# Patient Record
Sex: Female | Born: 1945 | Hispanic: No | Marital: Married | Smoking: Former smoker
Health system: Southern US, Community
[De-identification: ages and names within clinical notes are randomized; demographics above are authoritative.]

## PROBLEM LIST (undated history)

## (undated) DIAGNOSIS — Z9889 Other specified postprocedural states: Secondary | ICD-10-CM

## (undated) DIAGNOSIS — R112 Nausea with vomiting, unspecified: Secondary | ICD-10-CM

## (undated) HISTORY — PX: TONSILLECTOMY: SUR1361

## (undated) HISTORY — PX: ABDOMINAL HYSTERECTOMY: SHX81

---

## 1998-10-21 ENCOUNTER — Other Ambulatory Visit: Admission: RE | Admit: 1998-10-21 | Discharge: 1998-10-21 | Payer: Self-pay | Admitting: Obstetrics and Gynecology

## 2000-03-28 ENCOUNTER — Other Ambulatory Visit: Admission: RE | Admit: 2000-03-28 | Discharge: 2000-03-28 | Payer: Self-pay | Admitting: Obstetrics and Gynecology

## 2005-09-27 ENCOUNTER — Other Ambulatory Visit: Admission: RE | Admit: 2005-09-27 | Discharge: 2005-09-27 | Payer: Self-pay | Admitting: Family Medicine

## 2009-03-31 ENCOUNTER — Other Ambulatory Visit: Admission: RE | Admit: 2009-03-31 | Discharge: 2009-03-31 | Payer: Self-pay | Admitting: Family Medicine

## 2014-09-30 ENCOUNTER — Other Ambulatory Visit: Payer: Self-pay | Admitting: Orthopedic Surgery

## 2014-10-27 ENCOUNTER — Encounter (HOSPITAL_BASED_OUTPATIENT_CLINIC_OR_DEPARTMENT_OTHER): Payer: Self-pay

## 2014-10-30 ENCOUNTER — Ambulatory Visit (HOSPITAL_BASED_OUTPATIENT_CLINIC_OR_DEPARTMENT_OTHER): Payer: 59 | Admitting: Anesthesiology

## 2014-10-30 ENCOUNTER — Encounter (HOSPITAL_BASED_OUTPATIENT_CLINIC_OR_DEPARTMENT_OTHER)
Admission: RE | Disposition: A | Discharge status: Home / Self Care | Payer: Self-pay | Source: Ambulatory Visit | Attending: Orthopedic Surgery

## 2014-10-30 ENCOUNTER — Encounter (HOSPITAL_BASED_OUTPATIENT_CLINIC_OR_DEPARTMENT_OTHER): Payer: Self-pay | Admitting: Anesthesiology

## 2014-10-30 ENCOUNTER — Ambulatory Visit (HOSPITAL_BASED_OUTPATIENT_CLINIC_OR_DEPARTMENT_OTHER)
Admission: RE | Admit: 2014-10-30 | Discharge: 2014-10-30 | Disposition: A | Discharge status: Home / Self Care | Payer: 59 | Source: Ambulatory Visit | Attending: Orthopedic Surgery | Admitting: Orthopedic Surgery

## 2014-10-30 DIAGNOSIS — Z880 Allergy status to penicillin: Secondary | ICD-10-CM | POA: Insufficient documentation

## 2014-10-30 DIAGNOSIS — M19031 Primary osteoarthritis, right wrist: Secondary | ICD-10-CM | POA: Diagnosis not present

## 2014-10-30 DIAGNOSIS — G5601 Carpal tunnel syndrome, right upper limb: Secondary | ICD-10-CM | POA: Insufficient documentation

## 2014-10-30 DIAGNOSIS — Z90710 Acquired absence of both cervix and uterus: Secondary | ICD-10-CM | POA: Diagnosis not present

## 2014-10-30 DIAGNOSIS — Z79899 Other long term (current) drug therapy: Secondary | ICD-10-CM | POA: Diagnosis not present

## 2014-10-30 DIAGNOSIS — Z87891 Personal history of nicotine dependence: Secondary | ICD-10-CM | POA: Insufficient documentation

## 2014-10-30 HISTORY — DX: Other specified postprocedural states: Z98.890

## 2014-10-30 HISTORY — PX: CARPAL TUNNEL RELEASE: SHX101

## 2014-10-30 HISTORY — DX: Nausea with vomiting, unspecified: R11.2

## 2014-10-30 LAB — POCT HEMOGLOBIN-HEMACUE: HEMOGLOBIN: 14.2 g/dL (ref 12.0–15.0)

## 2014-10-30 SURGERY — CARPAL TUNNEL RELEASE
Anesthesia: Regional | Laterality: Right

## 2014-10-30 MED ORDER — FENTANYL CITRATE (PF) 100 MCG/2ML IJ SOLN: 25.0000 ug | INTRAMUSCULAR | Status: DC | PRN

## 2014-10-30 MED ORDER — LIDOCAINE HCL (CARDIAC) 20 MG/ML IV SOLN
INTRAVENOUS | Status: DC | PRN
  Administered 2014-10-30: 50 mg via INTRAVENOUS

## 2014-10-30 MED ORDER — ACETAMINOPHEN 160 MG/5ML PO SOLN: 325.0000 mg | Freq: Once | ORAL | Status: DC

## 2014-10-30 MED ORDER — VANCOMYCIN HCL IN DEXTROSE 1-5 GM/200ML-% IV SOLN
1000.0000 mg | INTRAVENOUS | Status: AC
  Administered 2014-10-30: 1000 mg via INTRAVENOUS

## 2014-10-30 MED ORDER — BUPIVACAINE HCL (PF) 0.25 % IJ SOLN
INTRAMUSCULAR | Status: DC | PRN
  Administered 2014-10-30: 8 mL

## 2014-10-30 MED ORDER — FENTANYL CITRATE (PF) 100 MCG/2ML IJ SOLN
INTRAMUSCULAR | Status: AC
  Filled 2014-10-30: qty 6

## 2014-10-30 MED ORDER — MIDAZOLAM HCL 2 MG/2ML IJ SOLN
INTRAMUSCULAR | Status: AC
  Filled 2014-10-30: qty 2

## 2014-10-30 MED ORDER — VANCOMYCIN HCL IN DEXTROSE 1-5 GM/200ML-% IV SOLN
INTRAVENOUS | Status: AC
  Filled 2014-10-30: qty 200

## 2014-10-30 MED ORDER — MIDAZOLAM HCL 2 MG/2ML IJ SOLN
1.0000 mg | INTRAMUSCULAR | Status: DC | PRN
  Administered 2014-10-30: 2 mg via INTRAVENOUS

## 2014-10-30 MED ORDER — PROPOFOL INFUSION 10 MG/ML OPTIME
INTRAVENOUS | Status: DC | PRN
  Administered 2014-10-30: 75 ug/kg/min via INTRAVENOUS

## 2014-10-30 MED ORDER — LACTATED RINGERS IV SOLN
INTRAVENOUS | Status: DC
Start: 2014-10-30 — End: 2014-10-30
  Administered 2014-10-30 (×2): via INTRAVENOUS

## 2014-10-30 MED ORDER — CHLORHEXIDINE GLUCONATE 4 % EX LIQD: 60.0000 mL | Freq: Once | CUTANEOUS | Status: DC

## 2014-10-30 MED ORDER — GLYCOPYRROLATE 0.2 MG/ML IJ SOLN: 0.2000 mg | Freq: Once | INTRAMUSCULAR | Status: DC | PRN

## 2014-10-30 MED ORDER — SCOPOLAMINE 1 MG/3DAYS TD PT72: 1.0000 | MEDICATED_PATCH | Freq: Once | TRANSDERMAL | Status: DC | PRN

## 2014-10-30 MED ORDER — FENTANYL CITRATE (PF) 100 MCG/2ML IJ SOLN
50.0000 ug | INTRAMUSCULAR | Status: DC | PRN
  Administered 2014-10-30: 25 ug via INTRAVENOUS

## 2014-10-30 MED ORDER — ACETAMINOPHEN 500 MG PO TABS
1000.0000 mg | ORAL_TABLET | Freq: Once | ORAL | Status: DC
Start: 2014-10-30 — End: 2014-10-30

## 2014-10-30 MED ORDER — PROMETHAZINE HCL 25 MG/ML IJ SOLN
6.2500 mg | INTRAMUSCULAR | Status: DC | PRN
Start: 2014-10-30 — End: 2014-10-30

## 2014-10-30 MED ORDER — HYDROCODONE-ACETAMINOPHEN 5-325 MG PO TABS: 1.0000 | ORAL_TABLET | Freq: Four times a day (QID) | ORAL | Status: DC | PRN

## 2014-10-30 SURGICAL SUPPLY — 35 items
BLADE SURG 15 STRL LF DISP TIS (BLADE) ×1 IMPLANT
BLADE SURG 15 STRL SS (BLADE) ×2
BNDG CMPR 9X4 STRL LF SNTH (GAUZE/BANDAGES/DRESSINGS)
BNDG COHESIVE 3X5 TAN STRL LF (GAUZE/BANDAGES/DRESSINGS) ×2 IMPLANT
BNDG ESMARK 4X9 LF (GAUZE/BANDAGES/DRESSINGS) IMPLANT
BNDG GAUZE ELAST 4 BULKY (GAUZE/BANDAGES/DRESSINGS) ×2 IMPLANT
CHLORAPREP W/TINT 26ML (MISCELLANEOUS) ×2 IMPLANT
CORDS BIPOLAR (ELECTRODE) ×2 IMPLANT
COVER BACK TABLE 60X90IN (DRAPES) ×2 IMPLANT
COVER MAYO STAND STRL (DRAPES) ×2 IMPLANT
CUFF TOURNIQUET SINGLE 18IN (TOURNIQUET CUFF) ×2 IMPLANT
DRAPE EXTREMITY T 121X128X90 (DRAPE) ×2 IMPLANT
DRAPE SURG 17X23 STRL (DRAPES) ×2 IMPLANT
DRSG PAD ABDOMINAL 8X10 ST (GAUZE/BANDAGES/DRESSINGS) ×2 IMPLANT
GAUZE SPONGE 4X4 12PLY STRL (GAUZE/BANDAGES/DRESSINGS) ×2 IMPLANT
GAUZE XEROFORM 1X8 LF (GAUZE/BANDAGES/DRESSINGS) ×2 IMPLANT
GLOVE BIOGEL PI IND STRL 7.0 (GLOVE) IMPLANT
GLOVE BIOGEL PI IND STRL 8.5 (GLOVE) ×1 IMPLANT
GLOVE BIOGEL PI INDICATOR 7.0 (GLOVE) ×2
GLOVE BIOGEL PI INDICATOR 8.5 (GLOVE) ×1
GLOVE ECLIPSE 6.5 STRL STRAW (GLOVE) ×1 IMPLANT
GLOVE SURG ORTHO 8.0 STRL STRW (GLOVE) ×2 IMPLANT
GOWN STRL REUS W/ TWL LRG LVL3 (GOWN DISPOSABLE) ×1 IMPLANT
GOWN STRL REUS W/TWL LRG LVL3 (GOWN DISPOSABLE) ×2
GOWN STRL REUS W/TWL XL LVL3 (GOWN DISPOSABLE) ×2 IMPLANT
NEEDLE PRECISIONGLIDE 27X1.5 (NEEDLE) IMPLANT
NS IRRIG 1000ML POUR BTL (IV SOLUTION) ×2 IMPLANT
PACK BASIN DAY SURGERY FS (CUSTOM PROCEDURE TRAY) ×2 IMPLANT
STOCKINETTE 4X48 STRL (DRAPES) ×2 IMPLANT
SUT ETHILON 4 0 PS 2 18 (SUTURE) ×2 IMPLANT
SUT VICRYL 4-0 PS2 18IN ABS (SUTURE) IMPLANT
SYR BULB 3OZ (MISCELLANEOUS) ×2 IMPLANT
SYR CONTROL 10ML LL (SYRINGE) ×2 IMPLANT
TOWEL OR 17X24 6PK STRL BLUE (TOWEL DISPOSABLE) ×2 IMPLANT
UNDERPAD 30X30 (UNDERPADS AND DIAPERS) ×3 IMPLANT

## 2014-10-30 NOTE — Brief Op Note (Signed)
10/30/2014  16:10 PM  PATIENT:  Susan Moreno  69 y.o. female  PRE-OPERATIVE DIAGNOSIS:  CARPEL TUNNEL SYNDROME RIGHT  POST-OPERATIVE DIAGNOSIS:  CARPEL TUNNEL SYNDROME RIGHT  PROCEDURE:  Procedure(s) with comments: CARPAL TUNNEL RELEASE RIGHT (Right) - ANESTHESIA: IV REGIONAL FAB  SURGEON:  Surgeon(s) and Role:    * Daryll Brod, MD - Primary  PHYSICIAN ASSISTANT:   ASSISTANTS: none   ANESTHESIA:   local and regional  EBL:  Total I/O In: 900 [I.V.:900] Out: -   BLOOD ADMINISTERED:none  DRAINS: none   LOCAL MEDICATIONS USED:  BUPIVICAINE   SPECIMEN:  No Specimen  DISPOSITION OF SPECIMEN:  N/A  COUNTS:  YES  TOURNIQUET:   Total Tourniquet Time Documented: Forearm (Right) - 16 minutes Total: Forearm (Right) - 16 minutes   DICTATION: .Other Dictation: Dictation Number [id number]  PLAN OF CARE: Discharge to home after PACU  PATIENT DISPOSITION:  PACU - hemodynamically stable.

## 2014-10-30 NOTE — H&P (Signed)
Susan Moreno is a 69 year old right hand dominant female who comes in complaining of numbness and tingling in her right hand that has been going on for a year and increasing over the past several months to the point that her right hand is numb virtually all the time. She is a Optometrist. She states this seems to aggravate it. She has no history of injury to the hand or neck. She is occasionally awakened at the present time. She states when it began she was frequently awakened at night with numbness and tingling. She has been taking Naprosyn at a decreased dosage. She states it helps other things but does not seem to make a difference in her hand. There is no history of diabetes, thyroid problems, arthritis or gout. She complains of intermittent, sharp pain that she feels is getting worse. Activity makes it worse especially reading. She has had her nerve conductions done by Dr. Zebedee Iba. This reveals a carpal tunnel syndrome bilaterally with motor delay of 6.8 on the right, 4.3 on the left, sensory delay of 3.1 on the right and 2.8 on the left. Amplitude diminution is to 26.7 on the left and 14 on the right  PAST MEDICAL HISTORY:  She is allergic to PCN. She is on Fenofibrate, Naprosyn, Pravastatin. She has had a hysterectomy.  FAMILY MEDICAL HISTORY: Positive for arthritis  SOCIAL HISTORY:  She does not smoke. She drinks socially. She is married and a Print production planner at [employer]  REVIEW OF SYSTEMS: Positive for glasses, contacts, sleep disorder, otherwise negative 14 points.  Susan Moreno is an 69 y.o. female.   Chief Complaint: Carpal tunnel right HPI: see above  Past Medical History  Diagnosis Date  . PONV (postoperative nausea and vomiting)     Past Surgical History  Procedure Laterality Date  . Tonsillectomy    . Abdominal hysterectomy      History reviewed. No pertinent family history. Social History:  reports that she drinks about 0.6 oz of alcohol per  week. She reports that she does not use illicit drugs. Her tobacco history is not on file.  Allergies:  Allergies  Allergen Reactions  . Penicillins Itching    No prescriptions prior to admission    No results found for this or any previous visit (from the past 48 hour(s)).  No results found.   Pertinent items are noted in HPI.  Height 5\' 4"  (1.626 m), weight 95.255 kg (210 lb).  General appearance: alert, cooperative and appears stated age Head: Normocephalic, without obvious abnormality Neck: no JVD Resp: clear to auscultation bilaterally Cardio: regular rate and rhythm, S1, S2 normal, no murmur, click, rub or gallop GI: soft, non-tender; bowel sounds normal; no masses,  no organomegaly Extremities: numbness right hand Pulses: 2+ and symmetric Skin: Skin color, texture, turgor normal. No rashes or lesions Neurologic: Grossly normal Incision/Wound: na  Assessment/Plan X-rays of her hand reveals degenerative changes of the [facility] joint of her thumb, PIP and DIP joints of all fingers.  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  PLAN: We discussed the possibility of surgical intervention especially on the right side. Pre, peri and post op care are discussed along with risks and complications. Patient is aware there is no guarantee with surgery, possibility of infection, injury to arteries, nerves, and tendons, incomplete relief and dystrophy. We have discussed the possibility of injections with her. She would like to have the  carpal tunnel release to her right side. She would like to proceed. This isscheduled at her convenience as an outpatient under regional anesthesia for right carpal tunnel release.  Bird Swetz R 10/30/2014, 7:39 AM

## 2014-10-30 NOTE — Discharge Instructions (Addendum)

## 2014-10-30 NOTE — Op Note (Signed)
Dictation Number [id number]

## 2014-10-30 NOTE — Transfer of Care (Signed)
Immediate Anesthesia Transfer of Care Note  Patient: Susan Moreno  Procedure(s) Performed: Procedure(s) with comments: CARPAL TUNNEL RELEASE RIGHT (Right) - ANESTHESIA: IV REGIONAL FAB  Patient Location: PACU  Anesthesia Type:Bier block  Level of Consciousness: awake, sedated and patient cooperative  Airway & Oxygen Therapy: Patient Spontanous Breathing and Patient connected to face mask oxygen  Post-op Assessment: Report given to RN and Post -op Vital signs reviewed and stable  Post vital signs: Reviewed and stable  Last Vitals:  Filed Vitals:   10/30/14 0952  BP: 159/85  Pulse: 69  Temp: 37.1 C  Resp: 16    Complications: No apparent anesthesia complications

## 2014-10-30 NOTE — Anesthesia Postprocedure Evaluation (Signed)
  Anesthesia Post-op Note  Patient: Susan Moreno  Procedure(s) Performed: Procedure(s) (LRB): CARPAL TUNNEL RELEASE RIGHT (Right)  Patient Location: PACU  Anesthesia Type: Bier block  Level of Consciousness: awake and alert   Airway and Oxygen Therapy: Patient Spontanous Breathing  Post-op Pain: mild  Post-op Assessment: Post-op Vital signs reviewed, Patient's Cardiovascular Status Stable, Respiratory Function Stable, Patent Airway and No signs of Nausea or vomiting  Last Vitals:  Filed Vitals:   10/30/14 0952  BP: 159/85  Pulse: 69  Temp: 37.1 C  Resp: 16    Post-op Vital Signs: stable   Complications: No apparent anesthesia complications

## 2014-10-30 NOTE — Anesthesia Preprocedure Evaluation (Addendum)
Anesthesia Evaluation  Patient identified by MRN, date of birth, ID band Patient awake    Reviewed: Allergy & Precautions, H&P , NPO status , Patient's Chart, lab work & pertinent test results  History of Anesthesia Complications (+) PONV and history of anesthetic complications  Airway Mallampati: II  TM Distance: >3 FB Neck ROM: full    Dental no notable dental hx.    Pulmonary neg pulmonary ROS, former smoker,  breath sounds clear to auscultation  Pulmonary exam normal       Cardiovascular Normal cardiovascular examRhythm:regular Rate:Normal  HLD   Neuro/Psych negative neurological ROS     GI/Hepatic negative GI ROS, Neg liver ROS,   Endo/Other  negative endocrine ROS  Renal/GU negative Renal ROS     Musculoskeletal  (+) Arthritis - (carpel tunnel release 10/30/2014),   Abdominal   Peds  Hematology negative hematology ROS (+)   Anesthesia Other Findings   Reproductive/Obstetrics negative OB ROS                            Anesthesia Physical Anesthesia Plan  ASA: II  Anesthesia Plan: Bier Block   Post-op Pain Management:    Induction: Intravenous  Airway Management Planned: Simple Face Mask  Additional Equipment:   Intra-op Plan:   Post-operative Plan:   Informed Consent: I have reviewed the patients History and Physical, chart, labs and discussed the procedure including the risks, benefits and alternatives for the proposed anesthesia with the patient or authorized representative who has indicated his/her understanding and acceptance.     Plan Discussed with: Anesthesiologist, CRNA and Surgeon  Anesthesia Plan Comments:        Anesthesia Quick Evaluation

## 2014-10-31 ENCOUNTER — Encounter (HOSPITAL_BASED_OUTPATIENT_CLINIC_OR_DEPARTMENT_OTHER): Payer: Self-pay | Admitting: Orthopedic Surgery

## 2014-10-31 NOTE — Op Note (Signed)
NAME:  Susan Moreno, Susan Moreno NO.:  [id number]  MEDICAL RECORD NO.:  [id number]  LOCATION:                                 FACILITY:  PHYSICIAN:  Daryll Brod, M.D.            DATE OF BIRTH:  DATE OF PROCEDURE:  10/30/2014 DATE OF DISCHARGE:                              OPERATIVE REPORT   PREOPERATIVE DIAGNOSIS:  Carpal tunnel syndrome, right hand.  POSTOPERATIVE DIAGNOSIS:  Carpal tunnel syndrome, right hand.  OPERATION:  Decompression of the right median nerve.  SURGEON:  Daryll Brod, MD.  ANESTHESIA:  Forearm-based IV regional with local infiltration.  HISTORY:  The patient is a 69 year old female with a history of carpal tunnel syndrome, nerve conductions are positive.  She has elected to undergo surgical release of the median nerve.  She is aware of risks and complications including infection, recurrence of injury to arteries, nerves, tendons; relief of symptoms,  dystrophy.  In preoperative area, the patient was seen, the extremity marked by both patient and surgeon. Antibiotic given.  PROCEDURE IN DETAIL:  The patient was brought to the operating room, where a  forearm-based IV regional anesthetic was carried out without difficulty.  She was prepped using ChloraPrep, supine position with the right arm free.  A 3-minute dry time was allowed.  Time-out taken, confirming the patient and procedure.  A longitudinal incision was made in the right palm, carried down through  subcutaneous tissue.  Bleeders were electrocauterized.  Palmar fascia was split.  Superficial palmar arch identified.  The flexor tendon to the ring and little finger identified to the ulnar side of the median nerve.  Carpal retinaculum was incised with sharp dissection.  Right angle and Sewall retractor were placed between skin and forearm fascia.  The fascia was  released for approximately a centimeter and half proximal to the wrist crease under direct vision.  Canal was explored.  Air  compression to the nerve was apparent.  No further lesions were identified.  The wound was copiously irrigated with saline and the skin closed with interrupted 4-0 nylon sutures.  Local infiltration with 0.25% bupivacaine without epinephrine was given, approximately 8 mL was used.  Sterile compressive dressing with fingers free was applied.  On deflation of the tourniquet, all fingers were immediately pinked.  She was taken to the recovery room for observation in satisfactory condition.  She will be discharged home to return to [place] in 1 week on [place].          ______________________________ Daryll Brod, M.D.    GK/MEDQ  D:  10/30/2014  T:  10/31/2014  Job:  [id number]

## 2016-02-16 DIAGNOSIS — H401131 Primary open-angle glaucoma, bilateral, mild stage: Secondary | ICD-10-CM | POA: Diagnosis not present

## 2016-03-02 DIAGNOSIS — Z23 Encounter for immunization: Secondary | ICD-10-CM | POA: Diagnosis not present

## 2016-04-26 DIAGNOSIS — H401131 Primary open-angle glaucoma, bilateral, mild stage: Secondary | ICD-10-CM | POA: Diagnosis not present

## 2016-04-28 DIAGNOSIS — R509 Fever, unspecified: Secondary | ICD-10-CM | POA: Diagnosis not present

## 2016-04-28 DIAGNOSIS — G4700 Insomnia, unspecified: Secondary | ICD-10-CM | POA: Diagnosis not present

## 2016-04-28 DIAGNOSIS — J111 Influenza due to unidentified influenza virus with other respiratory manifestations: Secondary | ICD-10-CM | POA: Diagnosis not present

## 2016-07-14 DIAGNOSIS — H401131 Primary open-angle glaucoma, bilateral, mild stage: Secondary | ICD-10-CM | POA: Diagnosis not present

## 2016-09-13 DIAGNOSIS — H401131 Primary open-angle glaucoma, bilateral, mild stage: Secondary | ICD-10-CM | POA: Diagnosis not present

## 2016-11-29 ENCOUNTER — Ambulatory Visit (INDEPENDENT_AMBULATORY_CARE_PROVIDER_SITE_OTHER): Payer: BLUE CROSS/BLUE SHIELD | Admitting: Podiatry

## 2016-11-29 ENCOUNTER — Encounter: Payer: Self-pay | Admitting: Podiatry

## 2016-11-29 ENCOUNTER — Ambulatory Visit (INDEPENDENT_AMBULATORY_CARE_PROVIDER_SITE_OTHER): Payer: BLUE CROSS/BLUE SHIELD

## 2016-11-29 VITALS — BP 149/91 | HR 99 | Resp 16

## 2016-11-29 DIAGNOSIS — M779 Enthesopathy, unspecified: Secondary | ICD-10-CM

## 2016-11-29 DIAGNOSIS — M7752 Other enthesopathy of left foot: Secondary | ICD-10-CM | POA: Diagnosis not present

## 2016-11-29 DIAGNOSIS — M2041 Other hammer toe(s) (acquired), right foot: Secondary | ICD-10-CM | POA: Diagnosis not present

## 2016-11-29 DIAGNOSIS — M778 Other enthesopathies, not elsewhere classified: Secondary | ICD-10-CM

## 2016-11-29 NOTE — Progress Notes (Signed)
  Subjective:  Patient ID: Susan Moreno, female    DOB: 11-23-1945,  MRN: [id number] HPI Chief Complaint  Patient presents with  . Ankle Pain    Lateral ankle left - pain and swelling x 1 year, no injury, numbness in 1st, 2nd, 3rd toes   . Toe Pain    2nd toe right - hammer toe  . Nail Problem    2nd nails bilateral and 1st left - concerned with how the nails are curving inward    71 y.o. female presents with the above complaint.   She presents today for chief complaint of swelling to the lateral aspect of the ankle and painful toes #2 #3 and #1 of the left foot. Denies any trauma today states that the pain as well as been present for nearly a year with no injury. No numbness to the toe started out initially periodic and now has become chronic.  Past Medical History:  Diagnosis Date  . PONV (postoperative nausea and vomiting)    Past Surgical History:  Procedure Laterality Date  . ABDOMINAL HYSTERECTOMY    . CARPAL TUNNEL RELEASE Right 10/30/2014   Procedure: CARPAL TUNNEL RELEASE RIGHT;  Surgeon: Daryll Brod, MD;  Location: [facility];  Service: Orthopedics;  Laterality: Right;  ANESTHESIA: IV REGIONAL FAB  . TONSILLECTOMY      Current Outpatient Prescriptions:  .  dorzolamide-timolol (COSOPT) 22.3-6.8 MG/ML ophthalmic solution, , Disp: , Rfl: 0 .  pravastatin (PRAVACHOL) 10 MG tablet, Take 40 mg by mouth daily. , Disp: , Rfl:  .  traZODone (DESYREL) 100 MG tablet, , Disp: , Rfl: 0  Allergies  Allergen Reactions  . Penicillins Itching   Review of Systems  Musculoskeletal: Positive for arthralgias and back pain.  All other systems reviewed and are negative.  Objective:   Vitals:   11/29/16 1444  BP: (!) 149/91  Pulse: 99  Resp: 16   General AA&O x3. Patient is alert and oriented.  Vascular Dorsalis pedis and posterior tibial pulses 2/4 bilat. Brisk capillary refill to all digits. Pedal hair present.  Neurologic Epicritic sensation grossly intact.    Dermatologic No open lesions. Interspaces clear of maceration. Nails well groomed and normal in appearance.  Orthopedic: MMT 5/5 in dorsiflexion, plantarflexion, inversion, and eversion. Normal joint ROM without pain or crepitus.She has pain on end range of motion of subtalar joint left foot. Large dorsal spur with what appears to be fluctuant cyst or ganglion overlying the base of the second metatarsal intermediate cuneiform joint left foot. Radiographs confirm dorsal spurring today and some early joint space narrowing of this subtalar joint.    Radiographs:  Radiographs taken today 3 views demonstrate dorsal spurring left foot ankle appears to be normal no complications early osteoarthritic change subtalar joint.  Assessment & Plan:   Assessment: Subtalar joint capsulitis left. Neuritis associated with dorsal spurring.  Plan: Discussed etiology pathology inserter versus surgical therapies. At this point I injected the subtalar joint of the left foot. We'll follow up with her in 1 month which time we will consider dorsal injection.     Susan Moreno T. [place], [place]

## 2016-12-16 DIAGNOSIS — H5213 Myopia, bilateral: Secondary | ICD-10-CM | POA: Diagnosis not present

## 2016-12-16 DIAGNOSIS — H25811 Combined forms of age-related cataract, right eye: Secondary | ICD-10-CM | POA: Diagnosis not present

## 2016-12-16 DIAGNOSIS — H04123 Dry eye syndrome of bilateral lacrimal glands: Secondary | ICD-10-CM | POA: Diagnosis not present

## 2016-12-16 DIAGNOSIS — H401134 Primary open-angle glaucoma, bilateral, indeterminate stage: Secondary | ICD-10-CM | POA: Diagnosis not present

## 2017-01-10 ENCOUNTER — Encounter: Payer: Self-pay | Admitting: Podiatry

## 2017-01-10 ENCOUNTER — Ambulatory Visit (INDEPENDENT_AMBULATORY_CARE_PROVIDER_SITE_OTHER): Payer: BLUE CROSS/BLUE SHIELD | Admitting: Podiatry

## 2017-01-10 DIAGNOSIS — M7752 Other enthesopathy of left foot: Secondary | ICD-10-CM

## 2017-01-10 DIAGNOSIS — G5792 Unspecified mononeuropathy of left lower limb: Secondary | ICD-10-CM | POA: Diagnosis not present

## 2017-01-10 DIAGNOSIS — M778 Other enthesopathies, not elsewhere classified: Secondary | ICD-10-CM

## 2017-01-10 DIAGNOSIS — M779 Enthesopathy, unspecified: Principal | ICD-10-CM

## 2017-01-10 NOTE — Progress Notes (Signed)
She presents today for follow-up of her subtalar joint capsulitis and neuritis to the toes. She states the ankle is still sore but the neuritis or the pain in the toes is cleared up almost completely. She states that she will be going to [place] and would like to follow-up with me prior to June if necessary.  Objective: Vital signs are stable she is alert and oriented 3 no reproducible pain other than some tendpalpation of the sinus tarsiof the left foot. We discussed appropriate shoe gear for her today.   Assessment:subtalar joint capsulitis with neuritis resolved.  Plan: Follow up with me prior to June for subtalar joint injection.

## 2017-01-17 DIAGNOSIS — L851 Acquired keratosis [keratoderma] palmaris et plantaris: Secondary | ICD-10-CM | POA: Diagnosis not present

## 2017-01-24 DIAGNOSIS — H25811 Combined forms of age-related cataract, right eye: Secondary | ICD-10-CM | POA: Diagnosis not present

## 2017-01-24 DIAGNOSIS — H52201 Unspecified astigmatism, right eye: Secondary | ICD-10-CM | POA: Diagnosis not present

## 2017-01-24 DIAGNOSIS — H268 Other specified cataract: Secondary | ICD-10-CM | POA: Diagnosis not present

## 2017-04-12 DIAGNOSIS — Z23 Encounter for immunization: Secondary | ICD-10-CM | POA: Diagnosis not present

## 2017-04-12 DIAGNOSIS — J309 Allergic rhinitis, unspecified: Secondary | ICD-10-CM | POA: Diagnosis not present

## 2017-04-12 DIAGNOSIS — G4700 Insomnia, unspecified: Secondary | ICD-10-CM | POA: Diagnosis not present

## 2017-04-12 DIAGNOSIS — E782 Mixed hyperlipidemia: Secondary | ICD-10-CM | POA: Diagnosis not present

## 2017-04-13 DIAGNOSIS — Z961 Presence of intraocular lens: Secondary | ICD-10-CM | POA: Diagnosis not present

## 2017-05-15 DIAGNOSIS — D23112 Other benign neoplasm of skin of right lower eyelid, including canthus: Secondary | ICD-10-CM | POA: Diagnosis not present

## 2017-05-15 DIAGNOSIS — H401132 Primary open-angle glaucoma, bilateral, moderate stage: Secondary | ICD-10-CM | POA: Diagnosis not present

## 2017-05-15 DIAGNOSIS — H26492 Other secondary cataract, left eye: Secondary | ICD-10-CM | POA: Diagnosis not present

## 2017-05-15 DIAGNOSIS — D23122 Other benign neoplasm of skin of left lower eyelid, including canthus: Secondary | ICD-10-CM | POA: Diagnosis not present

## 2017-07-27 DIAGNOSIS — D23111 Other benign neoplasm of skin of right upper eyelid, including canthus: Secondary | ICD-10-CM | POA: Diagnosis not present

## 2017-07-27 DIAGNOSIS — D23112 Other benign neoplasm of skin of right lower eyelid, including canthus: Secondary | ICD-10-CM | POA: Diagnosis not present

## 2017-07-27 DIAGNOSIS — D23121 Other benign neoplasm of skin of left upper eyelid, including canthus: Secondary | ICD-10-CM | POA: Diagnosis not present

## 2017-07-27 DIAGNOSIS — D23122 Other benign neoplasm of skin of left lower eyelid, including canthus: Secondary | ICD-10-CM | POA: Diagnosis not present

## 2017-08-17 DIAGNOSIS — Z23 Encounter for immunization: Secondary | ICD-10-CM | POA: Diagnosis not present

## 2017-08-24 DIAGNOSIS — H401132 Primary open-angle glaucoma, bilateral, moderate stage: Secondary | ICD-10-CM | POA: Diagnosis not present

## 2017-09-29 DIAGNOSIS — H26493 Other secondary cataract, bilateral: Secondary | ICD-10-CM | POA: Diagnosis not present

## 2017-09-29 DIAGNOSIS — H401132 Primary open-angle glaucoma, bilateral, moderate stage: Secondary | ICD-10-CM | POA: Diagnosis not present

## 2017-09-29 DIAGNOSIS — H43813 Vitreous degeneration, bilateral: Secondary | ICD-10-CM | POA: Diagnosis not present

## 2017-09-29 DIAGNOSIS — H53131 Sudden visual loss, right eye: Secondary | ICD-10-CM | POA: Diagnosis not present

## 2018-01-29 DIAGNOSIS — Z961 Presence of intraocular lens: Secondary | ICD-10-CM | POA: Diagnosis not present

## 2018-01-29 DIAGNOSIS — H26493 Other secondary cataract, bilateral: Secondary | ICD-10-CM | POA: Diagnosis not present

## 2018-01-29 DIAGNOSIS — H401132 Primary open-angle glaucoma, bilateral, moderate stage: Secondary | ICD-10-CM | POA: Diagnosis not present

## 2018-11-02 ENCOUNTER — Other Ambulatory Visit: Payer: Self-pay

## 2018-11-02 DIAGNOSIS — Z20822 Contact with and (suspected) exposure to covid-19: Secondary | ICD-10-CM

## 2018-11-03 LAB — NOVEL CORONAVIRUS, NAA: SARS-CoV-2, NAA: NOT DETECTED

## 2019-02-12 ENCOUNTER — Emergency Department (HOSPITAL_BASED_OUTPATIENT_CLINIC_OR_DEPARTMENT_OTHER): Payer: Medicare Other

## 2019-02-12 ENCOUNTER — Other Ambulatory Visit: Payer: Self-pay

## 2019-02-12 ENCOUNTER — Inpatient Hospital Stay (HOSPITAL_BASED_OUTPATIENT_CLINIC_OR_DEPARTMENT_OTHER)
Admission: EM | Admit: 2019-02-12 | Discharge: 2019-02-14 | DRG: 340 | Disposition: A | Discharge status: Home / Self Care | Payer: Medicare Other | Attending: Surgery | Admitting: Surgery

## 2019-02-12 ENCOUNTER — Encounter (HOSPITAL_BASED_OUTPATIENT_CLINIC_OR_DEPARTMENT_OTHER): Payer: Self-pay | Admitting: *Deleted

## 2019-02-12 DIAGNOSIS — Z79899 Other long term (current) drug therapy: Secondary | ICD-10-CM | POA: Diagnosis not present

## 2019-02-12 DIAGNOSIS — K3533 Acute appendicitis with perforation and localized peritonitis, with abscess: Principal | ICD-10-CM | POA: Diagnosis present

## 2019-02-12 DIAGNOSIS — Z87891 Personal history of nicotine dependence: Secondary | ICD-10-CM

## 2019-02-12 DIAGNOSIS — Z880 Allergy status to penicillin: Secondary | ICD-10-CM | POA: Diagnosis not present

## 2019-02-12 DIAGNOSIS — Z20828 Contact with and (suspected) exposure to other viral communicable diseases: Secondary | ICD-10-CM | POA: Diagnosis present

## 2019-02-12 DIAGNOSIS — Z6835 Body mass index (BMI) 35.0-35.9, adult: Secondary | ICD-10-CM | POA: Diagnosis not present

## 2019-02-12 DIAGNOSIS — Z90710 Acquired absence of both cervix and uterus: Secondary | ICD-10-CM

## 2019-02-12 DIAGNOSIS — R1031 Right lower quadrant pain: Secondary | ICD-10-CM

## 2019-02-12 DIAGNOSIS — R1011 Right upper quadrant pain: Secondary | ICD-10-CM | POA: Diagnosis present

## 2019-02-12 DIAGNOSIS — E669 Obesity, unspecified: Secondary | ICD-10-CM | POA: Diagnosis present

## 2019-02-12 DIAGNOSIS — K3532 Acute appendicitis with perforation and localized peritonitis, without abscess: Secondary | ICD-10-CM | POA: Diagnosis present

## 2019-02-12 LAB — COMPREHENSIVE METABOLIC PANEL
ALT: 19 U/L (ref 0–44)
AST: 17 U/L (ref 15–41)
Albumin: 3.4 g/dL — ABNORMAL LOW (ref 3.5–5.0)
Alkaline Phosphatase: 89 U/L (ref 38–126)
Anion gap: 10 (ref 5–15)
BUN: 15 mg/dL (ref 8–23)
CO2: 23 mmol/L (ref 22–32)
Calcium: 9.1 mg/dL (ref 8.9–10.3)
Chloride: 99 mmol/L (ref 98–111)
Creatinine, Ser: 0.54 mg/dL (ref 0.44–1.00)
GFR calc Af Amer: >60 mL/min (ref >60)
GFR calc non Af Amer: >60 mL/min (ref >60)
Glucose, Bld: 116 mg/dL — ABNORMAL HIGH (ref 70–99)
Potassium: 3.4 mmol/L — ABNORMAL LOW (ref 3.5–5.1)
Sodium: 132 mmol/L — ABNORMAL LOW (ref 135–145)
Total Bilirubin: 1.1 mg/dL (ref 0.3–1.2)
Total Protein: 6.7 g/dL (ref 6.5–8.1)

## 2019-02-12 LAB — CBC WITH DIFFERENTIAL/PLATELET
Abs Immature Granulocytes: 0.27 10*3/uL — ABNORMAL HIGH (ref 0.00–0.07)
Basophils Absolute: 0 10*3/uL (ref 0.0–0.1)
Basophils Relative: 0 %
Eosinophils Absolute: 0 10*3/uL (ref 0.0–0.5)
Eosinophils Relative: 0 %
HCT: 42.5 % (ref 36.0–46.0)
Hemoglobin: 14.1 g/dL (ref 12.0–15.0)
Immature Granulocytes: 2 %
Lymphocytes Relative: 4 %
Lymphs Abs: 0.6 10*3/uL — ABNORMAL LOW (ref 0.7–4.0)
MCH: 30.3 pg (ref 26.0–34.0)
MCHC: 33.2 g/dL (ref 30.0–36.0)
MCV: 91.4 fL (ref 80.0–100.0)
Monocytes Absolute: 0.7 10*3/uL (ref 0.1–1.0)
Monocytes Relative: 4 %
Neutro Abs: 16.3 10*3/uL — ABNORMAL HIGH (ref 1.7–7.7)
Neutrophils Relative %: 90 %
Platelets: 322 10*3/uL (ref 150–400)
RBC: 4.65 MIL/uL (ref 3.87–5.11)
RDW: 12.1 % (ref 11.5–15.5)
WBC: 17.9 10*3/uL — ABNORMAL HIGH (ref 4.0–10.5)
nRBC: 0 % (ref 0.0–0.2)

## 2019-02-12 LAB — URINALYSIS, ROUTINE W REFLEX MICROSCOPIC
Bilirubin Urine: NEGATIVE
Glucose, UA: NEGATIVE mg/dL
Ketones, ur: 15 mg/dL — AB
Nitrite: NEGATIVE
Protein, ur: NEGATIVE mg/dL
Specific Gravity, Urine: <1.005 — ABNORMAL LOW (ref 1.005–1.030)
pH: 7 (ref 5.0–8.0)

## 2019-02-12 LAB — URINALYSIS, MICROSCOPIC (REFLEX)

## 2019-02-12 LAB — LIPASE, BLOOD: Lipase: 17 U/L (ref 11–51)

## 2019-02-12 LAB — LACTIC ACID, PLASMA
Lactic Acid, Venous: 0.9 mmol/L (ref 0.5–1.9)
Lactic Acid, Venous: 0.9 mmol/L (ref 0.5–1.9)

## 2019-02-12 LAB — SARS CORONAVIRUS 2 AG (30 MIN TAT): SARS Coronavirus 2 Ag: NEGATIVE

## 2019-02-12 MED ORDER — ACETAMINOPHEN 325 MG PO TABS
650.0000 mg | ORAL_TABLET | Freq: Once | ORAL | Status: AC
  Administered 2019-02-12: 650 mg via ORAL
  Filled 2019-02-12: qty 2

## 2019-02-12 MED ORDER — ONDANSETRON HCL 4 MG/2ML IJ SOLN
4.0000 mg | Freq: Once | INTRAMUSCULAR | Status: AC
  Administered 2019-02-12: 4 mg via INTRAVENOUS

## 2019-02-12 MED ORDER — SODIUM CHLORIDE 0.9 % IV BOLUS: 1000.0000 mL | Freq: Once | INTRAVENOUS | Status: DC

## 2019-02-12 MED ORDER — ONDANSETRON HCL 4 MG/2ML IJ SOLN
4.0000 mg | Freq: Once | INTRAMUSCULAR | Status: AC
  Administered 2019-02-12: 4 mg via INTRAVENOUS
  Filled 2019-02-12: qty 2

## 2019-02-12 MED ORDER — METRONIDAZOLE IN NACL 5-0.79 MG/ML-% IV SOLN
500.0000 mg | Freq: Once | INTRAVENOUS | Status: AC
  Administered 2019-02-12: 500 mg via INTRAVENOUS
  Filled 2019-02-12: qty 100

## 2019-02-12 MED ORDER — ONDANSETRON HCL 4 MG/2ML IJ SOLN
INTRAMUSCULAR | Status: AC
  Filled 2019-02-12: qty 2

## 2019-02-12 MED ORDER — SODIUM CHLORIDE 0.9 % IV BOLUS
1000.0000 mL | Freq: Once | INTRAVENOUS | Status: AC
  Administered 2019-02-12: 1000 mL via INTRAVENOUS

## 2019-02-12 MED ORDER — MORPHINE SULFATE (PF) 4 MG/ML IV SOLN
4.0000 mg | Freq: Once | INTRAVENOUS | Status: AC
  Administered 2019-02-12: 4 mg via INTRAVENOUS
  Filled 2019-02-12: qty 1

## 2019-02-12 MED ORDER — CIPROFLOXACIN IN D5W 400 MG/200ML IV SOLN
400.0000 mg | Freq: Once | INTRAVENOUS | Status: AC
  Administered 2019-02-12: 400 mg via INTRAVENOUS
  Filled 2019-02-12: qty 200

## 2019-02-12 MED ORDER — IOHEXOL 300 MG/ML  SOLN
100.0000 mL | Freq: Once | INTRAMUSCULAR | Status: AC | PRN
  Administered 2019-02-12: 100 mL via INTRAVENOUS

## 2019-02-12 MED ORDER — ONDANSETRON 4 MG PO TBDP
4.0000 mg | ORAL_TABLET | Freq: Once | ORAL | Status: AC
  Administered 2019-02-12: 4 mg via ORAL
  Filled 2019-02-12: qty 1

## 2019-02-12 NOTE — ED Triage Notes (Signed)
Abdominal pain, fever, nausea, lightheaded x 3 days.

## 2019-02-12 NOTE — ED Provider Notes (Signed)
[facility] EMERGENCY DEPARTMENT Provider Note   CSN: [id number] Arrival date & time: 02/12/19  1442     History   Chief Complaint Chief Complaint  Patient presents with  . Abdominal Pain    HPI Susan Moreno is a 73 y.o. female.     The history is provided by the patient and medical records. No language interpreter was used.  Abdominal Pain Pain location:  RUQ and RLQ Pain quality: aching, pressure and sharp   Pain radiates to:  Does not radiate Pain severity:  Moderate Onset quality:  Gradual Duration:  4 days Timing:  Constant Progression:  Waxing and waning Chronicity:  New Context: not recent illness and not trauma   Relieved by:  Nothing Worsened by:  Palpation Ineffective treatments:  None tried Associated symptoms: anorexia, chills, constipation, fatigue, fever, nausea and vomiting   Associated symptoms: no chest pain, no cough, no diarrhea, no dysuria, no flatus, no melena, no shortness of breath, no vaginal bleeding and no vaginal discharge     Past Medical History:  Diagnosis Date  . PONV (postoperative nausea and vomiting)     There are no active problems to display for this patient.   Past Surgical History:  Procedure Laterality Date  . ABDOMINAL HYSTERECTOMY    . CARPAL TUNNEL RELEASE Right 10/30/2014   Procedure: CARPAL TUNNEL RELEASE RIGHT;  Surgeon: Daryll Brod, MD;  Location: [facility];  Service: Orthopedics;  Laterality: Right;  ANESTHESIA: IV REGIONAL FAB  . TONSILLECTOMY       OB History   No obstetric history on file.      Home Medications    Prior to Admission medications   Medication Sig Start Date End Date Taking? Authorizing Provider  dorzolamide-timolol (COSOPT) 22.3-6.8 MG/ML ophthalmic solution  11/05/16  Yes [provider]  Escitalopram Oxalate (LEXAPRO PO) Take by mouth.   Yes [provider]  latanoprost (XALATAN) 0.005 % ophthalmic solution INSTILL 1 DROP IN BOTH EYES QHS  09/20/18  Yes [provider]  pravastatin (PRAVACHOL) 40 MG tablet take 1 tablet by mouth once daily NEED OV FOR MORE REFILLES 12/16/16  Yes [provider]  traZODone (DESYREL) 100 MG tablet  10/27/16  Yes [provider]  FLUZONE HIGH-DOSE 0.5 ML injection inject 0.5 milliliter intramuscularly 11/30/16   [provider]    Family History No family history on file.  Social History Social History   Tobacco Use  . Smoking status: Former Smoker    Types: Cigarettes  . Smokeless tobacco: Never Used  . Tobacco comment: quit 41 years ago  Substance Use Topics  . Alcohol use: Yes    Alcohol/week: 1.0 standard drinks    Types: 1 Glasses of wine per week  . Drug use: No     Allergies   Penicillins   Review of Systems Review of Systems  Constitutional: Positive for chills, fatigue and fever. Negative for diaphoresis.  HENT: Negative for congestion.   Respiratory: Negative for cough, chest tightness, shortness of breath and wheezing.   Cardiovascular: Negative for chest pain, palpitations and leg swelling.  Gastrointestinal: Positive for abdominal pain, anorexia, constipation, nausea and vomiting. Negative for abdominal distention, diarrhea, flatus and melena.  Genitourinary: Positive for flank pain. Negative for decreased urine volume, dysuria, frequency, genital sores, vaginal bleeding, vaginal discharge and vaginal pain.  Musculoskeletal: Negative for back pain, neck pain and neck stiffness.  Skin: Negative for rash and wound.  Neurological: Negative for light-headedness and headaches.  Psychiatric/Behavioral: Negative for agitation.  All other systems reviewed and are negative.    Physical Exam Updated Vital Signs BP 131/86   Pulse (!) 116   Temp (!) 101.6 F (38.7 C) (Oral)   Resp 20   Ht 5\' 5"  (1.651 m)   Wt 95.7 kg   SpO2 94%   BMI 35.11 kg/m   Physical Exam Vitals signs and nursing note reviewed.  Constitutional:      General:  She is not in acute distress.    Appearance: She is well-developed. She is not ill-appearing, toxic-appearing or diaphoretic.  HENT:     Head: Normocephalic and atraumatic.     Right Ear: External ear normal.     Left Ear: External ear normal.     Nose: Nose normal.     Mouth/Throat:     Pharynx: No oropharyngeal exudate.  Eyes:     General: No scleral icterus.    Conjunctiva/sclera: Conjunctivae normal.     Pupils: Pupils are equal, round, and reactive to light.  Neck:     Musculoskeletal: Normal range of motion and neck supple.  Cardiovascular:     Rate and Rhythm: Regular rhythm. Tachycardia present.     Heart sounds: Normal heart sounds. No murmur.  Pulmonary:     Effort: Pulmonary effort is normal. No respiratory distress.     Breath sounds: No stridor. No wheezing, rhonchi or rales.  Chest:     Chest wall: No tenderness.  Abdominal:     General: Abdomen is flat. A surgical scar is present. Bowel sounds are normal. There is no distension. There are no signs of injury.     Tenderness: There is abdominal tenderness in the right upper quadrant and right lower quadrant. There is no right CVA tenderness, left CVA tenderness, guarding or rebound.       Comments: No rashes seen  Skin:    General: Skin is warm.     Findings: No erythema or rash.  Neurological:     Mental Status: She is alert and oriented to person, place, and time.     Motor: No abnormal muscle tone.     Coordination: Coordination normal.     Deep Tendon Reflexes: Reflexes are normal and symmetric.  Psychiatric:        Mood and Affect: Mood normal.      ED Treatments / Results  Labs (all labs ordered are listed, but only abnormal results are displayed) Labs Reviewed  CBC WITH DIFFERENTIAL/PLATELET - Abnormal; Notable for the following components:      Result Value   WBC 17.9 (*)    Neutro Abs 16.3 (*)    Lymphs Abs 0.6 (*)    Abs Immature Granulocytes 0.27 (*)    All other components within normal  limits  COMPREHENSIVE METABOLIC PANEL - Abnormal; Notable for the following components:   Sodium 132 (*)    Potassium 3.4 (*)    Glucose, Bld 116 (*)    Albumin 3.4 (*)    All other components within normal limits  URINALYSIS, ROUTINE W REFLEX MICROSCOPIC - Abnormal; Notable for the following components:   Specific Gravity, Urine <1.005 (*)    Hgb urine dipstick SMALL (*)    Ketones, ur 15 (*)    Leukocytes,Ua TRACE (*)    All other components within normal limits  URINALYSIS, MICROSCOPIC (REFLEX) - Abnormal; Notable for the following components:   Bacteria, UA RARE (*)    All other components within normal limits  SARS  CORONAVIRUS 2 AG (30 MIN TAT)  URINE CULTURE  CULTURE, BLOOD (ROUTINE X 2)  CULTURE, BLOOD (ROUTINE X 2)  SARS CORONAVIRUS 2 (TAT 6-24 HRS)  LIPASE, BLOOD  LACTIC ACID, PLASMA  LACTIC ACID, PLASMA    EKG EKG Interpretation  Date/Time:  Tuesday February 12 2019 15:23:36 EST Ventricular Rate:  112 PR Interval:    QRS Duration: 89 QT Interval:  318 QTC Calculation: 434 R Axis:   91 Text Interpretation: Sinus tachycardia Right axis deviation Borderline T abnormalities, inferior leads No prior ECG for comparison. No STEMI Confirmed by Antony Blackbird [phone]) on 02/12/2019 3:28:38 PM   Radiology Ct Abdomen Pelvis W Contrast  Result Date: 02/12/2019 CLINICAL DATA:  Right-sided abdominal pain, right upper quadrant and right mid abdomen. Nausea and vomiting. Decreased bowel movement. Fever, chills, tachycardia. EXAM: CT ABDOMEN AND PELVIS WITH CONTRAST TECHNIQUE: Multidetector CT imaging of the abdomen and pelvis was performed using the standard protocol following bolus administration of intravenous contrast. CONTRAST:  130mL OMNIPAQUE IOHEXOL 300 MG/ML  SOLN COMPARISON:  None. FINDINGS: Lower chest: Trace right pleural effusion. Right lower and middle lobe atelectasis. Heart is normal in size. Hepatobiliary: 6 mm hypodensity in the subcapsular right lobe of the liver,  too small to accurately characterize. Borderline hepatic steatosis. Gallbladder is distended without calcified gallstone. No pericholecystic inflammation. Common bile duct measures 7 mm, upper normal. Pancreas: No ductal dilatation or inflammation. Spleen: Normal in size without focal abnormality. Adrenals/Urinary Tract: No adrenal nodule. No hydronephrosis or perinephric edema. Homogeneous renal enhancement with symmetric excretion on delayed phase imaging. Simple cyst in the upper right kidney measures 2.4 cm. Urinary bladder is physiologically distended without wall thickening. Stomach/Bowel: Acute appendicitis as described below. Small hiatal hernia. Stomach is decompressed. No small bowel obstruction or inflammation, fluid-filled prominent small bowel in the right lower quadrant likely reactive. Moderate volume of stool throughout the colon. Distal colonic diverticulosis without diverticulitis. Appendix: Location: Retrocecal Diameter: 10 mm Appendicolith: No Mucosal hyper-enhancement: Yes Extraluminal gas: Yes, and tracking up into the right upper quadrant and abutting the inferior liver tip. Periappendiceal collection: Possibly, pancreatic tail is indistinct with surrounding inflammation. There is ill-defined soft tissue density adjacent to the appendiceal tip, series 2, image 53, without well-defined collection. Inflammatory change tracts into the right upper quadrant with small air-fluid collection inferior to the right lobe of the liver measuring 18 x 17 x 15 mm, series 2 image 45. Additional foci of air and fluid track in the perihepatic space in the right upper quadrant. Vascular/Lymphatic: Aortic atherosclerosis. No aneurysm. The portal vein and mesenteric vessels are patent. Few prominent ileocolic nodes are likely reactive. Reproductive: Status post hysterectomy. No adnexal masses. Other: Perforated appendicitis with air and fluid tracking into the right upper quadrant adjacent to the right lobe of the  liver. Tiny fat containing umbilical hernia. Musculoskeletal: Advanced degenerative change in the lower lumbar spine. There are no acute or suspicious osseous abnormalities. Critical Value/emergent results were called by telephone at the time of interpretation on 02/12/2019 at 5:29 pm to [place] , who verbally acknowledged these results. IMPRESSION: Perforated appendicitis with air and fluid tracking into the right upper quadrant and abutting the liver. Small abscess inferior to the liver tip measures 18 x 17 x 15 mm. Colonic diverticulosis without diverticulitis. Aortic Atherosclerosis (ICD10-I70.0). Electronically Signed   By: Keith Rake M.D.   On: 02/12/2019 17:30   Dg Chest Portable 1 View  Result Date: 02/12/2019 CLINICAL DATA:  73 year old female with right upper quadrant  abdominal pain and fever. EXAM: PORTABLE CHEST 1 VIEW COMPARISON:  None. FINDINGS: Mild eventration of the right hemidiaphragm. No focal consolidation, pleural effusion, pneumothorax. Mild cardiomegaly. No acute osseous pathology. IMPRESSION: 1. No acute cardiopulmonary process. 2. Mild cardiomegaly. Electronically Signed   By: Anner Crete M.D.   On: 02/12/2019 17:18    Procedures Procedures (including critical care time)  CRITICAL CARE Performed by: Gwenyth Allegra Mckennah Kretchmer Total critical care time: 35 minutes Critical care time was exclusive of separately billable procedures and treating other patients. Critical care was necessary to treat or prevent imminent or life-threatening deterioration. Critical care was time spent personally by me on the following activities: development of treatment plan with patient and/or surrogate as well as nursing, discussions with consultants, evaluation of patient's response to treatment, examination of patient, obtaining history from patient or surrogate, ordering and performing treatments and interventions, ordering and review of laboratory studies, ordering and  review of radiographic studies, pulse oximetry and re-evaluation of patient's condition.   Medications Ordered in ED Medications  ondansetron (ZOFRAN-ODT) disintegrating tablet 4 mg (4 mg Oral Given 02/12/19 1456)  acetaminophen (TYLENOL) tablet 650 mg (650 mg Oral Given 02/12/19 1456)  sodium chloride 0.9 % bolus 1,000 mL (0 mLs Intravenous Stopped 02/12/19 1806)  morphine 4 MG/ML injection 4 mg (4 mg Intravenous Given 02/12/19 1559)  ondansetron (ZOFRAN) injection 4 mg (4 mg Intravenous Given 02/12/19 1559)  iohexol (OMNIPAQUE) 300 MG/ML solution 100 mL (100 mLs Intravenous Contrast Given 02/12/19 1657)  ciprofloxacin (CIPRO) IVPB 400 mg (0 mg Intravenous Stopped 02/12/19 1922)    And  metroNIDAZOLE (FLAGYL) IVPB 500 mg (0 mg Intravenous Stopped 02/12/19 1922)     Initial Impression / Assessment and Plan / ED Course  I have reviewed the triage vital signs and the nursing notes.  Pertinent labs & imaging results that ware available during my care of the patient were reviewed by me and considered in my medical decision making (see chart for details).        RAHAB LOCKHEART is a 73 y.o. female with a past medical history significant for prior hysterectomy who presents with right-sided abdominal pain, fevers, chills, nausea, vomiting.  Patient reports that her symptoms have been ongoing for the last for 5 days.  She reports that she had no appetite and is having worsened right-sided abdominal pain.  She reports it is currently 5 out of 10 but has been slightly worse.  She reports nausea and vomiting at times.  She reports constipation but has had several bowel movements.  She denies any blood in her bowel movements.  She is still passing flatus.  She denies any urinary symptoms or history of kidney stones.  No history of shingles.  No rashes reported.  No history of cholecystitis or diverticulitis or appendicitis in the past.  She does report a hysterectomy.  She denies any trauma.  She denies any  chest pain, palpitations, shortness of breath.  She reports the fevers and chills have been ongoing for the last several days and her temperature on arrival was 101.6.  She denies any known Covid contacts.  On exam, patient does have right-sided abdominal tenderness in the right upper quadrant and right mid abdomen.  No CVA tenderness.  Bowel sounds were appreciated.  Lungs clear chest is nontender.  Back is nontender.  Patient is tachycardic and febrile on arrival.  Respiratory rate was in the 16-20 range.  She is maintaining oxygen saturations in the mid 90s on room air.  EKG shows tachycardia but no STEMI.  Clinically I am most concerned about acute cholecystitis versus right-sided diverticulitis versus stone versus appendicitis.  Given this broad range, will get CT abdomen pelvis to further evaluate and if CT is completely unremarkable, will consider ultrasound.  She will have labs and urinalysis.  Will she be given pain medicine, nausea medicine, and fluids as she reports she is feeling dehydrated.  She will have the rapid Covid test and if is negative, anticipate the PCR test ordered.  Anticipate reassessment after work-up to determine disposition.  5:45 PM CT scan was completed and revealed perforated appendicitis with abscess and air-fluid level.  General surgery will be called and antibiotics ordered.  Patient reports allergy with hives and itching to penicillin, will avoid.  Initial Covid test was negative and she has a normal lactic acid.  Patient does have leukocytosis.  Chest x-ray showed no acute cardiopulmonary process.  6:20 PM Spoke with Dr. Windle Guard with the general surgery at [facility] who recommends the IV antibiotics and admission.  They do not think they will do surgery tonight so she felt a 6-hour Covid test was appropriate.  They will discuss further surgical management after she arrives.  Bed request was placed and we are awaiting admission.    Final Clinical  Impressions(s) / ED Diagnoses   Final diagnoses:  Appendicitis with perforation  Right lower quadrant abdominal pain    Clinical Impression: 1. Appendicitis with perforation   2. Right lower quadrant abdominal pain     Disposition: Admit  This note was prepared with assistance of Dragon voice recognition software. Occasional wrong-word or sound-a-like substitutions may have occurred due to the inherent limitations of voice recognition software.      Baileigh Modisette, Gwenyth Allegra, MD 02/12/19 2045

## 2019-02-12 NOTE — ED Notes (Signed)
Last NPO intake, sips of water per pt at approx 2045; last food intake states in morning for breakfast.

## 2019-02-12 NOTE — ED Notes (Signed)
[facility] arrived to transfer pt to [facility] 

## 2019-02-12 NOTE — ED Notes (Signed)
Patient transported to CT 

## 2019-02-13 ENCOUNTER — Encounter (HOSPITAL_COMMUNITY): Payer: Self-pay | Admitting: Anesthesiology

## 2019-02-13 ENCOUNTER — Inpatient Hospital Stay (HOSPITAL_COMMUNITY): Payer: Medicare Other | Admitting: Anesthesiology

## 2019-02-13 ENCOUNTER — Encounter (HOSPITAL_COMMUNITY): Admission: EM | Disposition: A | Discharge status: Home / Self Care | Payer: Self-pay | Source: Home / Self Care

## 2019-02-13 HISTORY — PX: LAPAROSCOPIC APPENDECTOMY: SHX408

## 2019-02-13 LAB — SARS CORONAVIRUS 2 (TAT 6-24 HRS): SARS Coronavirus 2: NEGATIVE

## 2019-02-13 LAB — CBC
HCT: 39.8 % (ref 36.0–46.0)
Hemoglobin: 12.6 g/dL (ref 12.0–15.0)
MCH: 30.1 pg (ref 26.0–34.0)
MCHC: 31.7 g/dL (ref 30.0–36.0)
MCV: 95.2 fL (ref 80.0–100.0)
Platelets: 309 10*3/uL (ref 150–400)
RBC: 4.18 MIL/uL (ref 3.87–5.11)
RDW: 12.4 % (ref 11.5–15.5)
WBC: 20.6 10*3/uL — ABNORMAL HIGH (ref 4.0–10.5)
nRBC: 0 % (ref 0.0–0.2)

## 2019-02-13 LAB — BASIC METABOLIC PANEL
Anion gap: 10 (ref 5–15)
BUN: 19 mg/dL (ref 8–23)
CO2: 22 mmol/L (ref 22–32)
Calcium: 8.5 mg/dL — ABNORMAL LOW (ref 8.9–10.3)
Chloride: 104 mmol/L (ref 98–111)
Creatinine, Ser: 0.77 mg/dL (ref 0.44–1.00)
GFR calc Af Amer: >60 mL/min (ref >60)
GFR calc non Af Amer: >60 mL/min (ref >60)
Glucose, Bld: 127 mg/dL — ABNORMAL HIGH (ref 70–99)
Potassium: 3.6 mmol/L (ref 3.5–5.1)
Sodium: 136 mmol/L (ref 135–145)

## 2019-02-13 LAB — SURGICAL PCR SCREEN
MRSA, PCR: NEGATIVE
Staphylococcus aureus: NEGATIVE

## 2019-02-13 SURGERY — APPENDECTOMY, LAPAROSCOPIC
Anesthesia: General | Site: Abdomen

## 2019-02-13 MED ORDER — LACTATED RINGERS IV SOLN
INTRAVENOUS | Status: DC | PRN
  Administered 2019-02-13: 09:00:00 via INTRAVENOUS

## 2019-02-13 MED ORDER — ESCITALOPRAM OXALATE 10 MG PO TABS
10.0000 mg | ORAL_TABLET | Freq: Every day | ORAL | Status: DC
  Administered 2019-02-14: 10 mg via ORAL
  Filled 2019-02-13: qty 1

## 2019-02-13 MED ORDER — FENTANYL CITRATE (PF) 100 MCG/2ML IJ SOLN: 25.0000 ug | INTRAMUSCULAR | Status: DC | PRN

## 2019-02-13 MED ORDER — ROCURONIUM BROMIDE 10 MG/ML (PF) SYRINGE
PREFILLED_SYRINGE | INTRAVENOUS | Status: DC | PRN
  Administered 2019-02-13: 10 mg via INTRAVENOUS
  Administered 2019-02-13: 30 mg via INTRAVENOUS

## 2019-02-13 MED ORDER — TRAMADOL HCL 50 MG PO TABS: 50.0000 mg | ORAL_TABLET | Freq: Four times a day (QID) | ORAL | Status: DC | PRN

## 2019-02-13 MED ORDER — ONDANSETRON HCL 4 MG/2ML IJ SOLN: 4.0000 mg | Freq: Once | INTRAMUSCULAR | Status: DC | PRN

## 2019-02-13 MED ORDER — ONDANSETRON HCL 4 MG/2ML IJ SOLN
INTRAMUSCULAR | Status: AC
  Filled 2019-02-13: qty 2

## 2019-02-13 MED ORDER — CHLORHEXIDINE GLUCONATE CLOTH 2 % EX PADS
6.0000 | MEDICATED_PAD | Freq: Every day | CUTANEOUS | Status: DC
  Administered 2019-02-13: 6 via TOPICAL

## 2019-02-13 MED ORDER — ACETAMINOPHEN 10 MG/ML IV SOLN: 1000.0000 mg | Freq: Once | INTRAVENOUS | Status: DC | PRN

## 2019-02-13 MED ORDER — METOPROLOL TARTRATE 5 MG/5ML IV SOLN: 5.0000 mg | Freq: Four times a day (QID) | INTRAVENOUS | Status: DC | PRN

## 2019-02-13 MED ORDER — DORZOLAMIDE HCL-TIMOLOL MAL 2-0.5 % OP SOLN
1.0000 [drp] | Freq: Two times a day (BID) | OPHTHALMIC | Status: DC
  Filled 2019-02-13: qty 10

## 2019-02-13 MED ORDER — DIPHENHYDRAMINE HCL 12.5 MG/5ML PO ELIX: 12.5000 mg | ORAL_SOLUTION | Freq: Four times a day (QID) | ORAL | Status: DC | PRN

## 2019-02-13 MED ORDER — TIMOLOL MALEATE 0.5 % OP SOLN
1.0000 [drp] | Freq: Two times a day (BID) | OPHTHALMIC | Status: DC
  Administered 2019-02-13 – 2019-02-14 (×4): 1 [drp] via OPHTHALMIC
  Filled 2019-02-13: qty 5

## 2019-02-13 MED ORDER — ACETAMINOPHEN 325 MG PO TABS: 650.0000 mg | ORAL_TABLET | Freq: Four times a day (QID) | ORAL | Status: DC | PRN

## 2019-02-13 MED ORDER — LACTATED RINGERS IV SOLN
INTRAVENOUS | Status: DC
  Administered 2019-02-13: 11:00:00 via INTRAVENOUS

## 2019-02-13 MED ORDER — LATANOPROST 0.005 % OP SOLN
1.0000 [drp] | Freq: Every day | OPHTHALMIC | Status: DC
  Administered 2019-02-13 (×2): 1 [drp] via OPHTHALMIC
  Filled 2019-02-13: qty 2.5

## 2019-02-13 MED ORDER — EPHEDRINE 5 MG/ML INJ
INTRAVENOUS | Status: AC
  Filled 2019-02-13: qty 10

## 2019-02-13 MED ORDER — LIDOCAINE 2% (20 MG/ML) 5 ML SYRINGE
INTRAMUSCULAR | Status: DC | PRN
  Administered 2019-02-13: 100 mg via INTRAVENOUS

## 2019-02-13 MED ORDER — HYDRALAZINE HCL 20 MG/ML IJ SOLN: 10.0000 mg | INTRAMUSCULAR | Status: DC | PRN

## 2019-02-13 MED ORDER — SODIUM CHLORIDE 0.9 % IV SOLN
INTRAVENOUS | Status: DC
  Administered 2019-02-13: 08:00:00 via INTRAVENOUS
  Administered 2019-02-13: 1000 mL via INTRAVENOUS
  Administered 2019-02-13 – 2019-02-14 (×2): via INTRAVENOUS

## 2019-02-13 MED ORDER — ACETAMINOPHEN 650 MG RE SUPP: 650.0000 mg | Freq: Four times a day (QID) | RECTAL | Status: DC | PRN

## 2019-02-13 MED ORDER — DEXAMETHASONE SODIUM PHOSPHATE 10 MG/ML IJ SOLN
INTRAMUSCULAR | Status: DC | PRN
  Administered 2019-02-13: 10 mg via INTRAVENOUS

## 2019-02-13 MED ORDER — PROPOFOL 10 MG/ML IV BOLUS
INTRAVENOUS | Status: AC
  Filled 2019-02-13: qty 20

## 2019-02-13 MED ORDER — LACTATED RINGERS IR SOLN
Status: DC | PRN
  Administered 2019-02-13: 1500 mL

## 2019-02-13 MED ORDER — ONDANSETRON HCL 4 MG/2ML IJ SOLN
4.0000 mg | Freq: Four times a day (QID) | INTRAMUSCULAR | Status: DC | PRN
  Administered 2019-02-13: 4 mg via INTRAVENOUS

## 2019-02-13 MED ORDER — BISACODYL 10 MG RE SUPP: 10.0000 mg | Freq: Every day | RECTAL | Status: DC | PRN

## 2019-02-13 MED ORDER — PIPERACILLIN-TAZOBACTAM 3.375 G IVPB: 3.3750 g | Freq: Three times a day (TID) | INTRAVENOUS | Status: DC

## 2019-02-13 MED ORDER — DIPHENHYDRAMINE HCL 50 MG/ML IJ SOLN: 12.5000 mg | Freq: Four times a day (QID) | INTRAMUSCULAR | Status: DC | PRN

## 2019-02-13 MED ORDER — DEXAMETHASONE SODIUM PHOSPHATE 10 MG/ML IJ SOLN
INTRAMUSCULAR | Status: AC
  Filled 2019-02-13: qty 2

## 2019-02-13 MED ORDER — HEPARIN SODIUM (PORCINE) 5000 UNIT/ML IJ SOLN
5000.0000 [IU] | Freq: Three times a day (TID) | INTRAMUSCULAR | Status: DC
  Administered 2019-02-13 – 2019-02-14 (×6): 5000 [IU] via SUBCUTANEOUS
  Filled 2019-02-13 (×6): qty 1

## 2019-02-13 MED ORDER — ONDANSETRON 4 MG PO TBDP: 4.0000 mg | ORAL_TABLET | Freq: Four times a day (QID) | ORAL | Status: DC | PRN

## 2019-02-13 MED ORDER — BUPIVACAINE HCL (PF) 0.25 % IJ SOLN
INTRAMUSCULAR | Status: AC
  Filled 2019-02-13: qty 30

## 2019-02-13 MED ORDER — FENTANYL CITRATE (PF) 100 MCG/2ML IJ SOLN
INTRAMUSCULAR | Status: DC | PRN
  Administered 2019-02-13 (×2): 50 ug via INTRAVENOUS

## 2019-02-13 MED ORDER — PROPOFOL 10 MG/ML IV BOLUS
INTRAVENOUS | Status: DC | PRN
  Administered 2019-02-13: 150 mg via INTRAVENOUS

## 2019-02-13 MED ORDER — FENTANYL CITRATE (PF) 100 MCG/2ML IJ SOLN
INTRAMUSCULAR | Status: AC
  Filled 2019-02-13: qty 2

## 2019-02-13 MED ORDER — DOCUSATE SODIUM 100 MG PO CAPS
100.0000 mg | ORAL_CAPSULE | Freq: Two times a day (BID) | ORAL | Status: DC
  Administered 2019-02-13 – 2019-02-14 (×3): 100 mg via ORAL
  Filled 2019-02-13 (×3): qty 1

## 2019-02-13 MED ORDER — BUPIVACAINE-EPINEPHRINE 0.25% -1:200000 IJ SOLN
INTRAMUSCULAR | Status: DC | PRN
  Administered 2019-02-13: 20 mL

## 2019-02-13 MED ORDER — TRAZODONE HCL 100 MG PO TABS
100.0000 mg | ORAL_TABLET | Freq: Every evening | ORAL | Status: DC | PRN
  Administered 2019-02-14: 100 mg via ORAL
  Filled 2019-02-13: qty 1

## 2019-02-13 MED ORDER — SODIUM CHLORIDE 0.9 % IV SOLN
2.0000 g | Freq: Three times a day (TID) | INTRAVENOUS | Status: DC
  Administered 2019-02-13 – 2019-02-14 (×5): 2 g via INTRAVENOUS
  Filled 2019-02-13 (×6): qty 2

## 2019-02-13 MED ORDER — DORZOLAMIDE HCL 2 % OP SOLN
1.0000 [drp] | Freq: Two times a day (BID) | OPHTHALMIC | Status: DC
  Administered 2019-02-13 – 2019-02-14 (×4): 1 [drp] via OPHTHALMIC
  Filled 2019-02-13: qty 10

## 2019-02-13 MED ORDER — KETOROLAC TROMETHAMINE 15 MG/ML IJ SOLN: 15.0000 mg | Freq: Four times a day (QID) | INTRAMUSCULAR | Status: DC | PRN

## 2019-02-13 MED ORDER — EPHEDRINE SULFATE-NACL 50-0.9 MG/10ML-% IV SOSY
PREFILLED_SYRINGE | INTRAVENOUS | Status: DC | PRN
  Administered 2019-02-13 (×2): 10 mg via INTRAVENOUS

## 2019-02-13 MED ORDER — SUGAMMADEX SODIUM 200 MG/2ML IV SOLN
INTRAVENOUS | Status: DC | PRN
  Administered 2019-02-13 (×2): 200 mg via INTRAVENOUS

## 2019-02-13 MED ORDER — SUCCINYLCHOLINE CHLORIDE 200 MG/10ML IV SOSY
PREFILLED_SYRINGE | INTRAVENOUS | Status: DC | PRN
  Administered 2019-02-13: 120 mg via INTRAVENOUS

## 2019-02-13 MED ORDER — HYDROMORPHONE HCL 1 MG/ML IJ SOLN
0.5000 mg | INTRAMUSCULAR | Status: DC | PRN
  Administered 2019-02-13 (×2): 0.5 mg via INTRAVENOUS
  Filled 2019-02-13 (×2): qty 0.5

## 2019-02-13 MED ORDER — MUPIROCIN 2 % EX OINT: 1.0000 "application " | TOPICAL_OINTMENT | Freq: Two times a day (BID) | CUTANEOUS | Status: DC

## 2019-02-13 MED ORDER — METRONIDAZOLE IN NACL 5-0.79 MG/ML-% IV SOLN
500.0000 mg | Freq: Three times a day (TID) | INTRAVENOUS | Status: DC
  Administered 2019-02-13 – 2019-02-14 (×6): 500 mg via INTRAVENOUS
  Filled 2019-02-13 (×6): qty 100

## 2019-02-13 MED ORDER — MEPERIDINE HCL 50 MG/ML IJ SOLN: 6.2500 mg | INTRAMUSCULAR | Status: DC | PRN

## 2019-02-13 MED ORDER — OXYCODONE HCL 5 MG PO TABS
5.0000 mg | ORAL_TABLET | ORAL | Status: DC | PRN
  Administered 2019-02-13: 10 mg via ORAL
  Filled 2019-02-13: qty 2

## 2019-02-13 SURGICAL SUPPLY — 48 items
ADH SKN CLS APL DERMABOND .7 (GAUZE/BANDAGES/DRESSINGS) ×1
APL PRP STRL LF DISP 70% ISPRP (MISCELLANEOUS) ×1
APL SKNCLS STERI-STRIP NONHPOA (GAUZE/BANDAGES/DRESSINGS)
APPLIER CLIP ROT 10 11.4 M/L (STAPLE)
APR CLP MED LRG 11.4X10 (STAPLE)
BAG SPEC RTRVL 10 TROC 200 (ENDOMECHANICALS) ×1
BAG SPEC RTRVL LRG 6X4 10 (ENDOMECHANICALS)
BENZOIN TINCTURE PRP APPL 2/3 (GAUZE/BANDAGES/DRESSINGS) IMPLANT
CABLE HIGH FREQUENCY MONO STRZ (ELECTRODE) IMPLANT
CHLORAPREP W/TINT 26 (MISCELLANEOUS) ×2 IMPLANT
CLIP APPLIE ROT 10 11.4 M/L (STAPLE) IMPLANT
COVER SURGICAL LIGHT HANDLE (MISCELLANEOUS) ×2 IMPLANT
COVER WAND RF STERILE (DRAPES) IMPLANT
CUTTER FLEX LINEAR 45M (STAPLE) ×2 IMPLANT
DECANTER SPIKE VIAL GLASS SM (MISCELLANEOUS) ×2 IMPLANT
DERMABOND ADVANCED (GAUZE/BANDAGES/DRESSINGS) ×1
DERMABOND ADVANCED .7 DNX12 (GAUZE/BANDAGES/DRESSINGS) ×1 IMPLANT
ELECT REM PT RETURN 15FT ADLT (MISCELLANEOUS) ×2 IMPLANT
ENDOLOOP SUT PDS II  0 18 (SUTURE)
ENDOLOOP SUT PDS II 0 18 (SUTURE) IMPLANT
GLOVE SURG SYN 7.5  E (GLOVE) ×1
GLOVE SURG SYN 7.5 E (GLOVE) ×1 IMPLANT
GLOVE SURG SYN 7.5 PF PI (GLOVE) ×1 IMPLANT
GOWN STRL REUS W/TWL XL LVL3 (GOWN DISPOSABLE) ×4 IMPLANT
KIT BASIN OR (CUSTOM PROCEDURE TRAY) ×2 IMPLANT
KIT TURNOVER KIT A (KITS) IMPLANT
PENCIL SMOKE EVACUATOR (MISCELLANEOUS) IMPLANT
POUCH RETRIEVAL ECOSAC 10 (ENDOMECHANICALS) IMPLANT
POUCH RETRIEVAL ECOSAC 10MM (ENDOMECHANICALS) ×1
POUCH SPECIMEN RETRIEVAL 10MM (ENDOMECHANICALS) IMPLANT
RELOAD 45 VASCULAR/THIN (ENDOMECHANICALS) IMPLANT
RELOAD STAPLE 45 2.5 WHT GRN (ENDOMECHANICALS) IMPLANT
RELOAD STAPLE 45 3.5 BLU ETS (ENDOMECHANICALS) IMPLANT
RELOAD STAPLE TA45 3.5 REG BLU (ENDOMECHANICALS) ×2 IMPLANT
SCISSORS LAP 5X35 DISP (ENDOMECHANICALS) ×2 IMPLANT
SET IRRIG TUBING LAPAROSCOPIC (IRRIGATION / IRRIGATOR) ×2 IMPLANT
SET TUBE SMOKE EVAC HIGH FLOW (TUBING) ×2 IMPLANT
SHEARS HARMONIC ACE PLUS 36CM (ENDOMECHANICALS) ×2 IMPLANT
SLEEVE XCEL OPT CAN 5 100 (ENDOMECHANICALS) ×2 IMPLANT
STRIP CLOSURE SKIN 1/2X4 (GAUZE/BANDAGES/DRESSINGS) IMPLANT
SUT MNCRL AB 4-0 PS2 18 (SUTURE) ×2 IMPLANT
SUT VIC AB 2-0 SH 18 (SUTURE) IMPLANT
TOWEL OR 17X26 10 PK STRL BLUE (TOWEL DISPOSABLE) ×2 IMPLANT
TOWEL OR NON WOVEN STRL DISP B (DISPOSABLE) ×2 IMPLANT
TRAY LAPAROSCOPIC (CUSTOM PROCEDURE TRAY) ×2 IMPLANT
TROCAR BLADELESS OPT 5 100 (ENDOMECHANICALS) ×2 IMPLANT
TROCAR XCEL 12X100 BLDLESS (ENDOMECHANICALS) ×1 IMPLANT
TROCAR XCEL BLUNT TIP 100MML (ENDOMECHANICALS) ×2 IMPLANT

## 2019-02-13 NOTE — Anesthesia Procedure Notes (Signed)
Procedure Name: Intubation Date/Time: 02/13/2019 11:47 AM Performed by: Lavina Hamman, CRNA Pre-anesthesia Checklist: Patient identified, Emergency Drugs available, Suction available, Patient being monitored and Timeout performed Patient Re-evaluated:Patient Re-evaluated prior to induction Oxygen Delivery Method: Circle system utilized Preoxygenation: Pre-oxygenation with 100% oxygen Induction Type: IV induction Ventilation: Mask ventilation without difficulty Laryngoscope Size: Mac and 3 Grade View: Grade II Tube type: Oral Tube size: 7.0 mm Number of attempts: 1 Airway Equipment and Method: Stylet Placement Confirmation: ETT inserted through vocal cords under direct vision,  positive ETCO2,  CO2 detector and breath sounds checked- equal and bilateral Secured at: 21 cm Tube secured with: Tape Dental Injury: Teeth and Oropharynx as per pre-operative assessment

## 2019-02-13 NOTE — Addendum Note (Signed)
Addendum  created 02/13/19 1403 by Lavina Hamman, CRNA   Intraprocedure Meds edited

## 2019-02-13 NOTE — Anesthesia Preprocedure Evaluation (Signed)
Anesthesia Evaluation  Patient identified by MRN, date of birth, ID band Patient awake    Reviewed: Allergy & Precautions, NPO status , Patient's Chart, lab work & pertinent test results  History of Anesthesia Complications (+) PONV and history of anesthetic complications  Airway Mallampati: I       Dental no notable dental hx. (+) Teeth Intact   Pulmonary former smoker,    Pulmonary exam normal breath sounds clear to auscultation       Cardiovascular negative cardio ROS Normal cardiovascular exam Rhythm:Regular Rate:Normal     Neuro/Psych negative neurological ROS  negative psych ROS   GI/Hepatic negative GI ROS, Neg liver ROS,   Endo/Other  negative endocrine ROS  Renal/GU negative Renal ROS  negative genitourinary   Musculoskeletal negative musculoskeletal ROS (+)   Abdominal (+) + obese,   Peds  Hematology negative hematology ROS (+)   Anesthesia Other Findings   Reproductive/Obstetrics                             Anesthesia Physical Anesthesia Plan  ASA: II  Anesthesia Plan: General   Post-op Pain Management:    Induction: Intravenous  PONV Risk Score and Plan: 4 or greater and Dexamethasone and Ondansetron  Airway Management Planned: Oral ETT  Additional Equipment: None  Intra-op Plan:   Post-operative Plan: Extubation in OR  Informed Consent: I have reviewed the patients History and Physical, chart, labs and discussed the procedure including the risks, benefits and alternatives for the proposed anesthesia with the patient or authorized representative who has indicated his/her understanding and acceptance.     Dental advisory given  Plan Discussed with: CRNA  Anesthesia Plan Comments:         Anesthesia Quick Evaluation

## 2019-02-13 NOTE — Transfer of Care (Signed)
Immediate Anesthesia Transfer of Care Note  Patient: Susan Moreno  Procedure(s) Performed: Procedure(s): APPENDECTOMY LAPAROSCOPIC OF RUPTURED APPENDIX (N/A)  Patient Location: PACU  Anesthesia Type:General  Level of Consciousness:  sedated, patient cooperative and responds to stimulation  Airway & Oxygen Therapy:Patient Spontanous Breathing and Patient connected to face mask oxgen  Post-op Assessment:  Report given to PACU RN and Post -op Vital signs reviewed and stable  Post vital signs:  Reviewed and stable  Last Vitals:  Vitals:   02/13/19 0459 02/13/19 1010  BP: 132/83 124/67  Pulse: 87 84  Resp: 18 16  Temp: 37.1 C 37.1 C  SpO2: 0000000 0000000    Complications: No apparent anesthesia complications

## 2019-02-13 NOTE — Progress Notes (Signed)
Agree with Dr. Ron Parker assessment.  I think that she will be best served with proceeding with lap appendectomy.  I discussed with the patient the indications and risks of appendiceal surgery.  The primary risks of appendiceal surgery include, but are not limited to, bleeding, infection, bowel surgery, and open surgery.  There is also the risk that the patient may have continued symptoms after surgery.  We discussed the typical post-operative recovery course. I tried to answer the patient's questions.  She is widowed.  She has two children who live out of state.  Her contact is her daughter, Vaughan Basta, who lives in [place].  Alphonsa Overall, MD, [facility] Surgery Office phone:  [phone]

## 2019-02-13 NOTE — Anesthesia Postprocedure Evaluation (Signed)
Anesthesia Post Note  Patient: Susan Moreno  Procedure(s) Performed: APPENDECTOMY LAPAROSCOPIC OF RUPTURED APPENDIX (N/A Abdomen)     Patient location during evaluation: PACU Anesthesia Type: General Level of consciousness: awake and sedated Pain management: pain level controlled Vital Signs Assessment: post-procedure vital signs reviewed and stable Respiratory status: spontaneous breathing Cardiovascular status: stable Postop Assessment: no apparent nausea or vomiting Anesthetic complications: no    Last Vitals:  Vitals:   02/13/19 1315 02/13/19 1330  BP: (!) 113/59 (!) 131/58  Pulse: 89 83  Resp: 16 18  Temp:  37.2 C  SpO2: 100% 93%    Last Pain:  Vitals:   02/13/19 1330  TempSrc:   PainSc: 0-No pain   Pain Goal: Patients Stated Pain Goal: 2 (02/13/19 0826)                 Huston Foley

## 2019-02-13 NOTE — H&P (Signed)
Surgical H&P  CC: Abdominal pain  HPI: This is a very pleasant 73 year old woman who presented to [facility] with several days of right-sided abdominal pain, nausea, vomiting, fever and chills.  Symptoms began on Saturday with pain on the right side radiating somewhat around to the back; she felt that she had pulled a muscle.  On Sunday however she developed nausea and spent much of the day vomiting.  Began to have fevers and fatigue.  On evaluation at [facility] this evening (day 4 of symptoms) she was found to have a leukocytosis to 17.9 and a CT scan was completed which was notable for perforated appendicitis with a very small abscess and inflammatory change/fluid/air tracking up the right side towards the liver.   Prior abdominal surgery includes a hysterectomy via low midline vertical incision.   Allergies  Allergen Reactions  . Penicillins Itching    Past Medical History:  Diagnosis Date  . PONV (postoperative nausea and vomiting)     Past Surgical History:  Procedure Laterality Date  . ABDOMINAL HYSTERECTOMY    . CARPAL TUNNEL RELEASE Right 10/30/2014   Procedure: CARPAL TUNNEL RELEASE RIGHT;  Surgeon: Daryll Brod, MD;  Location: [facility];  Service: Orthopedics;  Laterality: Right;  ANESTHESIA: IV REGIONAL FAB  . TONSILLECTOMY      No family history on file.  Social History   Socioeconomic History  . Marital status: Married    Spouse name: Not on file  . Number of children: Not on file  . Years of education: Not on file  . Highest education level: Not on file  Occupational History  . Not on file  Social Needs  . Financial resource strain: Not on file  . Food insecurity    Worry: Not on file    Inability: Not on file  . Transportation needs    Medical: Not on file    Non-medical: Not on file  Tobacco Use  . Smoking status: Former Smoker    Types: Cigarettes  . Smokeless tobacco: Never Used  . Tobacco comment: quit 41 years ago   Substance and Sexual Activity  . Alcohol use: Yes    Alcohol/week: 1.0 standard drinks    Types: 1 Glasses of wine per week  . Drug use: No  . Sexual activity: Yes    Birth control/protection: Post-menopausal  Lifestyle  . Physical activity    Days per week: Not on file    Minutes per session: Not on file  . Stress: Not on file  Relationships  . Social Herbalist on phone: Not on file    Gets together: Not on file    Attends religious service: Not on file    Active member of club or organization: Not on file    Attends meetings of clubs or organizations: Not on file    Relationship status: Not on file  Other Topics Concern  . Not on file  Social History Narrative  . Not on file    No current facility-administered medications on file prior to encounter.    Current Outpatient Medications on File Prior to Encounter  Medication Sig Dispense Refill  . calcium carbonate (OS-CAL - DOSED IN MG OF ELEMENTAL CALCIUM) 1250 (500 Ca) MG tablet Take 1 tablet by mouth daily with breakfast.    . dorzolamide-timolol (COSOPT) 22.3-6.8 MG/ML ophthalmic solution Place 1 drop into both eyes 2 (two) times daily.   0  . escitalopram (LEXAPRO) 10 MG  tablet Take 10 mg by mouth daily.    Marland Kitchen latanoprost (XALATAN) 0.005 % ophthalmic solution Place 1 drop into both eyes at bedtime.     . pravastatin (PRAVACHOL) 40 MG tablet Take 40 mg by mouth daily.   0  . senna (SENOKOT) 8.6 MG tablet Take 1 tablet by mouth daily.    . traZODone (DESYREL) 100 MG tablet Take 100 mg by mouth at bedtime as needed for sleep.   0  . FLUZONE HIGH-DOSE 0.5 ML injection inject 0.5 milliliter intramuscularly  0    Review of Systems: a complete, 10pt review of systems was completed with pertinent positives and negatives as documented in the HPI  Physical Exam: Vitals:   02/12/19 2147 02/12/19 2327  BP: 101/61 124/72  Pulse: 96 96  Resp: 18 (!) 22  Temp: 98.8 F (37.1 C) 98.7 F (37.1 C)  SpO2: 92% 93%    Gen: A&Ox3, no distress  Head: normocephalic, atraumatic Eyes: extraocular motions intact, anicteric.  Chest: unlabored respirations, symmetrical air entry Cardiovascular: RRR with palpable distal pulses Abdomen: soft, nondistended, tender in the right upper more so than the right lower quadrant without peritoneal signs, no referred tenderness.  Well-healed low midline scar. No mass or organomegaly.  Extremities: warm, without edema, no deformities  Neuro: grossly intact Psych: appropriate mood and affect, normal insight  Skin: warm and dry   CBC Latest Ref Rng & Units 02/12/2019 10/30/2014  WBC 4.0 - 10.5 K/uL 17.9(H) -  Hemoglobin 12.0 - 15.0 g/dL 14.1 14.2  Hematocrit 36.0 - 46.0 % 42.5 -  Platelets 150 - 400 K/uL 322 -    CMP Latest Ref Rng & Units 02/12/2019  Glucose 70 - 99 mg/dL 116(H)  BUN 8 - 23 mg/dL 15  Creatinine 0.44 - 1.00 mg/dL 0.54  Sodium 135 - 145 mmol/L 132(L)  Potassium 3.5 - 5.1 mmol/L 3.4(L)  Chloride 98 - 111 mmol/L 99  CO2 22 - 32 mmol/L 23  Calcium 8.9 - 10.3 mg/dL 9.1  Total Protein 6.5 - 8.1 g/dL 6.7  Total Bilirubin 0.3 - 1.2 mg/dL 1.1  Alkaline Phos 38 - 126 U/L 89  AST 15 - 41 U/L 17  ALT 0 - 44 U/L 19    No results found for: INR, PROTIME  Imaging: Ct Abdomen Pelvis W Contrast  Result Date: 02/12/2019 CLINICAL DATA:  Right-sided abdominal pain, right upper quadrant and right mid abdomen. Nausea and vomiting. Decreased bowel movement. Fever, chills, tachycardia. EXAM: CT ABDOMEN AND PELVIS WITH CONTRAST TECHNIQUE: Multidetector CT imaging of the abdomen and pelvis was performed using the standard protocol following bolus administration of intravenous contrast. CONTRAST:  135mL OMNIPAQUE IOHEXOL 300 MG/ML  SOLN COMPARISON:  None. FINDINGS: Lower chest: Trace right pleural effusion. Right lower and middle lobe atelectasis. Heart is normal in size. Hepatobiliary: 6 mm hypodensity in the subcapsular right lobe of the liver, too small to accurately  characterize. Borderline hepatic steatosis. Gallbladder is distended without calcified gallstone. No pericholecystic inflammation. Common bile duct measures 7 mm, upper normal. Pancreas: No ductal dilatation or inflammation. Spleen: Normal in size without focal abnormality. Adrenals/Urinary Tract: No adrenal nodule. No hydronephrosis or perinephric edema. Homogeneous renal enhancement with symmetric excretion on delayed phase imaging. Simple cyst in the upper right kidney measures 2.4 cm. Urinary bladder is physiologically distended without wall thickening. Stomach/Bowel: Acute appendicitis as described below. Small hiatal hernia. Stomach is decompressed. No small bowel obstruction or inflammation, fluid-filled prominent small bowel in the right lower quadrant likely reactive.  Moderate volume of stool throughout the colon. Distal colonic diverticulosis without diverticulitis. Appendix: Location: Retrocecal Diameter: 10 mm Appendicolith: No Mucosal hyper-enhancement: Yes Extraluminal gas: Yes, and tracking up into the right upper quadrant and abutting the inferior liver tip. Periappendiceal collection: Possibly, pancreatic tail is indistinct with surrounding inflammation. There is ill-defined soft tissue density adjacent to the appendiceal tip, series 2, image 53, without well-defined collection. Inflammatory change tracts into the right upper quadrant with small air-fluid collection inferior to the right lobe of the liver measuring 18 x 17 x 15 mm, series 2 image 45. Additional foci of air and fluid track in the perihepatic space in the right upper quadrant. Vascular/Lymphatic: Aortic atherosclerosis. No aneurysm. The portal vein and mesenteric vessels are patent. Few prominent ileocolic nodes are likely reactive. Reproductive: Status post hysterectomy. No adnexal masses. Other: Perforated appendicitis with air and fluid tracking into the right upper quadrant adjacent to the right lobe of the liver. Tiny fat  containing umbilical hernia. Musculoskeletal: Advanced degenerative change in the lower lumbar spine. There are no acute or suspicious osseous abnormalities. Critical Value/emergent results were called by telephone at the time of interpretation on 02/12/2019 at 5:29 pm to [place] , who verbally acknowledged these results. IMPRESSION: Perforated appendicitis with air and fluid tracking into the right upper quadrant and abutting the liver. Small abscess inferior to the liver tip measures 18 x 17 x 15 mm. Colonic diverticulosis without diverticulitis. Aortic Atherosclerosis (ICD10-I70.0). Electronically Signed   By: Keith Rake M.D.   On: 02/12/2019 17:30   Dg Chest Portable 1 View  Result Date: 02/12/2019 CLINICAL DATA:  73 year old female with right upper quadrant abdominal pain and fever. EXAM: PORTABLE CHEST 1 VIEW COMPARISON:  None. FINDINGS: Mild eventration of the right hemidiaphragm. No focal consolidation, pleural effusion, pneumothorax. Mild cardiomegaly. No acute osseous pathology. IMPRESSION: 1. No acute cardiopulmonary process. 2. Mild cardiomegaly. Electronically Signed   By: Anner Crete M.D.   On: 02/12/2019 17:18     A/P: 73 year old woman with perforated appendicitis.  Discussed treatment options including bowel rest and antibiotics, which if successful, may necessitate repeat CT in a few days and/or percutaneous drainage of any enlarging abscess followed by interval appendectomy.  On occasion this treatment does fail and surgery is pursued.  Other option is an attempt at laparoscopic appendectomy which in this setting carries an increased risk of open conversion or more extensive resection, along with standard risks of bleeding, infection, pain, scarring, injury to intra-abdominal structures, risk of staple line leak or delayed abscess, failure to resolve symptoms, postoperative ileus, incisional hernia, as well as general cardiovascular, pulmonary and  thromboembolic risks. Questions were welcomed and answered to the patient's satisfaction at this time, and I let her know that the decision for surgery versus medical treatment will be deferred to the day team (Dr. Lucia Gaskins).  Will initiate broad-spectrum antibiotic therapy and fluid resuscitation.   Patient Active Problem List   Diagnosis Date Noted  . Appendicitis with perforation 02/12/2019       Romana Juniper, MD [facility], [place]  See AMION to contact appropriate on-call provider

## 2019-02-13 NOTE — Progress Notes (Signed)
Pharmacy Antibiotic Note  SYBEL KOSTIUK is a 73 y.o. female admitted on 02/12/2019 with Intra-abdominal infection.  Pharmacy has been consulted for Cefepime, flagyl dosing.  Plan: Cefepime 2gm iv q8hr Flagyl 500mg  iv q8hr  Height: 5\' 5"  (165.1 cm) Weight: 211 lb (95.7 kg) IBW/kg (Calculated) : 57  Temp (24hrs), Avg:99.4 F (37.4 C), Min:98.7 F (37.1 C), Max:101.6 F (38.7 C)  Recent Labs  Lab 02/12/19 1604 02/12/19 1755  WBC 17.9*  --   CREATININE 0.54  --   LATICACIDVEN 0.9 0.9    Estimated Creatinine Clearance: 71.7 mL/min (by C-G formula based on SCr of 0.54 mg/dL).    Allergies  Allergen Reactions  . Penicillins Itching    Antimicrobials this admission: Cefepime 02/13/2019 >> Flagyl 02/12/2019 >>   Dose adjustments this admission: -  Microbiology results: -  Thank you for allowing pharmacy to be a part of this patient's care.  Nani Skillern Crowford 02/13/2019 6:31 AM

## 2019-02-13 NOTE — Op Note (Signed)
Re:   Susan Moreno DOB:   [birth year] MRN:   [id number]                   FACILITY:  [facility]  DATE OF PROCEDURE: 02/13/2019                              OPERATIVE REPORT  PREOPERATIVE DIAGNOSIS:  Appendicitis  POSTOPERATIVE DIAGNOSIS:  Acute perforated appendicitis with abscess  PROCEDURE:  Laparoscopic appendectomy.  SURGEON:  Fenton Malling. Lucia Gaskins, MD  ASSISTANT:  No first assistant.  ANESTHESIA:  General endotracheal.  Anesthesiologist: Lyn Hollingshead, MD CRNA: Lavina Hamman, CRNA  ASA:  2 and emergent  ESTIMATED BLOOD LOSS:  Minimal.  DRAINS: none   SPECIMEN:   Appendix  COUNTS CORRECT:  YES  INDICATIONS FOR PROCEDURE: Susan Moreno is a 73 y.o. (DOB: Sep 16, 1945) white female whose primary care doctor is Harlan Stains, MD and comes to the OR for an appendectomy.   I discussed with the patient, the indications and potential complications of appendiceal surgery.  The potential complications include, but are not limited to, bleeding, open surgery, bowel resection, and the possibility of another diagnosis.  OPERATIVE NOTE:  The patient underwent a general endotracheal anesthetic as supervised by Anesthesiologist: Lyn Hollingshead, MD CRNA: Lavina Hamman, CRNA, General, in [place] room #4 at [facility].  The patient was on Maxipem prior to the beginning of the procedure and the abdomen was prepped with ChloraPrep.   A time-out was held and surgical checklist run.  I accessed her abdomen with a 5 mm Optiview Ethicon trocar in the right upper quadrant.    I placed a 5 mm trocar in the right upper quadrant and a 12 mm torcar just below the umbilicus and did abdominal exploration.    She had adhesions to her upper  The right and left lobes of liver unremarkable.  Stomach was unremarkable.  The pelvic organs were unremarkable.  I saw no other intra-abdominal abnormality.  The patient had a ruptured appendicitis with the appendix located retrocecal and going up to the  right lobe of the liver.  In rolling the appendix over, I found an approximate 10 cc abscess associated with the mid body of the appendix where it had ruptured.  The appendix was not ruptured.  The mesentery of the appendix was divided with a Harmonic scalpel.  I got to the base of the appendix.  I then used a blue load 45 mm Ethicon Endo-GIA stapler and fired this across the base of the appendix.  I placed the appendix in Eco Sac bag and delivered the bag through the umbilical incision.  I irrigated the abdomen with 2,000 cc of saline.  After irrigating the abdomen, I then removed the trocars, in turn.   I closed the skin each site with a 4-0 Monocryl suture and painted the wounds with DermaBond.  I then injected a total of 20 mL of 0.25% Marcaine at the incisions.  Sponge and needle count were correct at the end of the case.  The patient was transferred to the recovery room in good condition.  The patient tolerated the procedure well and it depends on the patient's post op clinical course as to when the patient could be discharged.   Alphonsa Overall, MD, [facility] Pager: [phone] Office phone:  [phone]

## 2019-02-14 ENCOUNTER — Encounter (HOSPITAL_COMMUNITY): Payer: Self-pay | Admitting: Surgery

## 2019-02-14 LAB — CBC
HCT: 38.5 % (ref 36.0–46.0)
Hemoglobin: 12.2 g/dL (ref 12.0–15.0)
MCH: 31 pg (ref 26.0–34.0)
MCHC: 31.7 g/dL (ref 30.0–36.0)
MCV: 97.7 fL (ref 80.0–100.0)
Platelets: 339 10*3/uL (ref 150–400)
RBC: 3.94 MIL/uL (ref 3.87–5.11)
RDW: 12.4 % (ref 11.5–15.5)
WBC: 14.6 10*3/uL — ABNORMAL HIGH (ref 4.0–10.5)
nRBC: 0 % (ref 0.0–0.2)

## 2019-02-14 LAB — URINE CULTURE

## 2019-02-14 LAB — SURGICAL PATHOLOGY

## 2019-02-14 MED ORDER — ACETAMINOPHEN 325 MG PO TABS: 650.0000 mg | ORAL_TABLET | ORAL | Status: AC | PRN

## 2019-02-14 MED ORDER — CIPROFLOXACIN HCL 500 MG PO TABS: 500.0000 mg | ORAL_TABLET | Freq: Two times a day (BID) | ORAL | 0 refills | Status: AC

## 2019-02-14 MED ORDER — CIPROFLOXACIN HCL 500 MG PO TABS
500.0000 mg | ORAL_TABLET | Freq: Two times a day (BID) | ORAL | Status: DC
  Administered 2019-02-14: 500 mg via ORAL
  Filled 2019-02-14: qty 1

## 2019-02-14 MED ORDER — METRONIDAZOLE 500 MG PO TABS: 500.0000 mg | ORAL_TABLET | Freq: Three times a day (TID) | ORAL | 0 refills | Status: AC

## 2019-02-14 MED ORDER — OXYCODONE HCL 5 MG PO TABS: 5.0000 mg | ORAL_TABLET | ORAL | 0 refills | Status: AC | PRN

## 2019-02-14 MED ORDER — METRONIDAZOLE 500 MG PO TABS: 500.0000 mg | ORAL_TABLET | Freq: Three times a day (TID) | ORAL | Status: DC

## 2019-02-14 NOTE — Progress Notes (Signed)
Assessment unchanged. Pt verbalized understanding of dc instructions through teach back. Understands when to call the doctor. Discharged via wc to front entrance accompanied by NT, Belinda.

## 2019-02-14 NOTE — Progress Notes (Addendum)
[facility]/Trauma Progress Note  1 Day Post-Op   Assessment/Plan  Acute perforated appendicitis with abscess - S/P Laparoscopic appendectomy, Dr. Lucia Gaskins, 12/02 - tolerating CLD and having flatus. Not taking pain medicine - home later today if she tolerates a reg diet - CBC pending to check WBC - will need a total of 7 days of abx  FEN: soft diet VTE: SCD's, heparin ID: Maxipime & Flagyl 12/02>> Foley: none Follow up: CCS 2 weeks   LOS: 2 days   Subjective: CC: no complaints  No abdominal pain. She states she is not taking pain medicine. No N, V, fever or chills overnight. Tolerating liquids. Having flatus.   Objective: Vital signs in last 24 hours: Temp:  [97.6 F (36.4 C)-98.9 F (37.2 C)] 97.6 F (36.4 C) (12/03 0450) Pulse Rate:  [55-101] 55 (12/03 0450) Resp:  [14-21] 16 (12/03 0450) BP: (102-145)/(58-78) 118/73 (12/03 0450) SpO2:  [89 %-100 %] 94 % (12/03 0450) Last BM Date: 02/10/19  Intake/Output from previous day: 12/02 0701 - 12/03 0700 In: 4370.8 [P.O.:420; I.V.:3263.6; IV Piggyback:687.2] Out: 1250 [Urine:1200; Blood:50] Intake/Output this shift: No intake/output data recorded.  PE:  Gen:  Alert, NAD, pleasant, cooperative Card:  RRR, no M/G/R heard Pulm:  CTA, no W/R/R, rate and effort normal Abd: Soft, ND, +BS, incisions with glue intact are well appearingl and are without bleeding, drainage or signs of infection. Mild TTP of upper abdomen without guarding. No peritonitis  Skin: no rashes noted, warm and dry   Anti-infectives: Anti-infectives (From admission, onward)   Start     Dose/Rate Route Frequency Ordered Stop   02/13/19 0156  ceFEPIme (MAXIPIME) 2 g in sodium chloride 0.9 % 100 mL IVPB     2 g 200 mL/hr over 30 Minutes Intravenous Every 8 hours 02/13/19 0047     02/13/19 0100  metroNIDAZOLE (FLAGYL) IVPB 500 mg     500 mg 100 mL/hr over 60 Minutes Intravenous Every 8 hours 02/13/19 0047     02/13/19 0030   piperacillin-tazobactam (ZOSYN) IVPB 3.375 g  Status:  Discontinued     3.375 g 12.5 mL/hr over 240 Minutes Intravenous Every 8 hours 02/13/19 0025 02/13/19 0042   02/12/19 1745  ciprofloxacin (CIPRO) IVPB 400 mg     400 mg 200 mL/hr over 60 Minutes Intravenous  Once 02/12/19 1730 02/12/19 1922   02/12/19 1745  metroNIDAZOLE (FLAGYL) IVPB 500 mg     500 mg 100 mL/hr over 60 Minutes Intravenous  Once 02/12/19 1730 02/12/19 1922      Lab Results:  Recent Labs    02/12/19 1604 02/13/19 0610  WBC 17.9* 20.6*  HGB 14.1 12.6  HCT 42.5 39.8  PLT 322 309   BMET Recent Labs    02/12/19 1604 02/13/19 0610  NA 132* 136  K 3.4* 3.6  CL 99 104  CO2 23 22  GLUCOSE 116* 127*  BUN 15 19  CREATININE 0.54 0.77  CALCIUM 9.1 8.5*   PT/INR No results for input(s): LABPROT, INR in the last 72 hours. CMP     Component Value Date/Time   NA 136 02/13/2019 0610   K 3.6 02/13/2019 0610   CL 104 02/13/2019 0610   CO2 22 02/13/2019 0610   GLUCOSE 127 (H) 02/13/2019 0610   BUN 19 02/13/2019 0610   CREATININE 0.77 02/13/2019 0610   CALCIUM 8.5 (L) 02/13/2019 0610   PROT 6.7 02/12/2019 1604   ALBUMIN 3.4 (L) 02/12/2019 1604   AST 17 02/12/2019 1604  ALT 19 02/12/2019 1604   ALKPHOS 89 02/12/2019 1604   BILITOT 1.1 02/12/2019 1604   GFRNONAA >60 02/13/2019 0610   GFRAA >60 02/13/2019 0610   Lipase     Component Value Date/Time   LIPASE 17 02/12/2019 1604    Studies/Results: Ct Abdomen Pelvis W Contrast  Result Date: 02/12/2019 CLINICAL DATA:  Right-sided abdominal pain, right upper quadrant and right mid abdomen. Nausea and vomiting. Decreased bowel movement. Fever, chills, tachycardia. EXAM: CT ABDOMEN AND PELVIS WITH CONTRAST TECHNIQUE: Multidetector CT imaging of the abdomen and pelvis was performed using the standard protocol following bolus administration of intravenous contrast. CONTRAST:  158mL OMNIPAQUE IOHEXOL 300 MG/ML  SOLN COMPARISON:  None. FINDINGS: Lower chest: Trace  right pleural effusion. Right lower and middle lobe atelectasis. Heart is normal in size. Hepatobiliary: 6 mm hypodensity in the subcapsular right lobe of the liver, too small to accurately characterize. Borderline hepatic steatosis. Gallbladder is distended without calcified gallstone. No pericholecystic inflammation. Common bile duct measures 7 mm, upper normal. Pancreas: No ductal dilatation or inflammation. Spleen: Normal in size without focal abnormality. Adrenals/Urinary Tract: No adrenal nodule. No hydronephrosis or perinephric edema. Homogeneous renal enhancement with symmetric excretion on delayed phase imaging. Simple cyst in the upper right kidney measures 2.4 cm. Urinary bladder is physiologically distended without wall thickening. Stomach/Bowel: Acute appendicitis as described below. Small hiatal hernia. Stomach is decompressed. No small bowel obstruction or inflammation, fluid-filled prominent small bowel in the right lower quadrant likely reactive. Moderate volume of stool throughout the colon. Distal colonic diverticulosis without diverticulitis. Appendix: Location: Retrocecal Diameter: 10 mm Appendicolith: No Mucosal hyper-enhancement: Yes Extraluminal gas: Yes, and tracking up into the right upper quadrant and abutting the inferior liver tip. Periappendiceal collection: Possibly, pancreatic tail is indistinct with surrounding inflammation. There is ill-defined soft tissue density adjacent to the appendiceal tip, series 2, image 53, without well-defined collection. Inflammatory change tracts into the right upper quadrant with small air-fluid collection inferior to the right lobe of the liver measuring 18 x 17 x 15 mm, series 2 image 45. Additional foci of air and fluid track in the perihepatic space in the right upper quadrant. Vascular/Lymphatic: Aortic atherosclerosis. No aneurysm. The portal vein and mesenteric vessels are patent. Few prominent ileocolic nodes are likely reactive. Reproductive:  Status post hysterectomy. No adnexal masses. Other: Perforated appendicitis with air and fluid tracking into the right upper quadrant adjacent to the right lobe of the liver. Tiny fat containing umbilical hernia. Musculoskeletal: Advanced degenerative change in the lower lumbar spine. There are no acute or suspicious osseous abnormalities. Critical Value/emergent results were called by telephone at the time of interpretation on 02/12/2019 at 5:29 pm to [place] , who verbally acknowledged these results. IMPRESSION: Perforated appendicitis with air and fluid tracking into the right upper quadrant and abutting the liver. Small abscess inferior to the liver tip measures 18 x 17 x 15 mm. Colonic diverticulosis without diverticulitis. Aortic Atherosclerosis (ICD10-I70.0). Electronically Signed   By: Keith Rake M.D.   On: 02/12/2019 17:30   Dg Chest Portable 1 View  Result Date: 02/12/2019 CLINICAL DATA:  73 year old female with right upper quadrant abdominal pain and fever. EXAM: PORTABLE CHEST 1 VIEW COMPARISON:  None. FINDINGS: Mild eventration of the right hemidiaphragm. No focal consolidation, pleural effusion, pneumothorax. Mild cardiomegaly. No acute osseous pathology. IMPRESSION: 1. No acute cardiopulmonary process. 2. Mild cardiomegaly. Electronically Signed   By: Anner Crete M.D.   On: 02/12/2019 17:18     Kalman Drape,  PA-C [facility] Please see amion for pager for the following: M, T, W, & Friday 7:00am - 4:30pm Thursdays 7:00am -11:30am  Agree with above.  Looks good.   Does not have much of an appetite. Probably home later today.  Alphonsa Overall, MD, [facility] Office phone:  [phone]

## 2019-02-14 NOTE — Discharge Instructions (Signed)
LAPAROSCOPIC SURGERY: POST OP INSTRUCTIONS  1. DIET: Follow a light bland diet the first 24 hours after arrival home, such as soup, liquids, crackers, etc. Be sure to include lots of fluids daily. Avoid fast food or heavy meals as your are more likely to get nauseated. Eat a low fat the next few days after surgery.  2. Take your usually prescribed home medications unless otherwise directed. 3. PAIN CONTROL:  1. Pain is best controlled by a usual combination of three different methods TOGETHER:  1. Ice/Heat 2. Over the counter pain medication 3. Prescription pain medication 2. Most patients will experience some swelling and bruising around the incisions. Ice packs or heating pads (30-60 minutes up to 6 times a day) will help. Use ice for the first few days to help decrease swelling and bruising, then switch to heat to help relax tight/sore spots and speed recovery. Some people prefer to use ice alone, heat alone, alternating between ice & heat. Experiment to what works for you. Swelling and bruising can take several weeks to resolve.  3. It is helpful to take an over-the-counter pain medication regularly for the first few weeks. Choose one of the following that works best for you:  1. Naproxen (Aleve, etc) Two 220mg  tabs twice a day 2. Ibuprofen (Advil, etc) Three 200mg  tabs four times a day (every meal & bedtime) 3. Acetaminophen (Tylenol, etc) 500-650mg  four times a day (every meal & bedtime) 4. A prescription for pain medication (such as oxycodone, hydrocodone, etc) should be given to you upon discharge. Take your pain medication as prescribed.  1. If you are having problems/concerns with the prescription medicine (does not control pain, nausea, vomiting, rash, itching, etc), please call us [phone] to see if we need to switch you to a different pain medicine that will work better for you and/or control your side effect better. 2. If you need a refill on your pain medication, please  contact your pharmacy. They will contact our office to request authorization. Prescriptions will not be filled after 5 pm or on week-ends. 4. Avoid getting constipated. Between the surgery and the pain medications, it is common to experience some constipation. Increasing fluid intake and taking a fiber supplement (such as Metamucil, Citrucel, FiberCon, MiraLax, etc) 1-2 times a day regularly will usually help prevent this problem from occurring. A mild laxative (prune juice, Milk of Magnesia, MiraLax, etc) should be taken according to package directions if there are no bowel movements after 48 hours.  5. Watch out for diarrhea. If you have many loose bowel movements, simplify your diet to bland foods & liquids for a few days. Stop any stool softeners and decrease your fiber supplement. Switching to mild anti-diarrheal medications (Kayopectate, Pepto Bismol) can help. If this worsens or does not improve, please call us. 6. Wash / shower every day. You may shower over the dressings as they are waterproof. Continue to shower over incision(s) after the dressing is off. 7. Remove your waterproof bandages 5 days after surgery. You may leave the incision open to air. You may replace a dressing/Band-Aid to cover the incision for comfort if you wish.  8. ACTIVITIES as tolerated:  1. You may resume regular (light) daily activities beginning the next day--such as daily self-care, walking, climbing stairs--gradually increasing activities as tolerated. If you can walk 30 minutes without difficulty, it is safe to try more intense activity such as jogging, treadmill, bicycling, low-impact aerobics, swimming, etc. 2. Save the most intensive and strenuous  activity for last such as sit-ups, heavy lifting, contact sports, etc Refrain from any heavy lifting or straining until you are off narcotics for pain control.  3. DO NOT PUSH THROUGH PAIN. Let pain be your guide: If it hurts to do something, don't do it. Pain is your body  warning you to avoid that activity for another week until the pain goes down. 4. You may drive when you are no longer taking prescription pain medication, you can comfortably wear a seatbelt, and you can safely maneuver your car and apply brakes. 5. You may have sexual intercourse when it is comfortable.  9. FOLLOW UP in our office  1. Please call [facility] at [phone] to set up an appointment to see your surgeon in the office for a follow-up appointment approximately 2-3 weeks after your surgery. 2. Make sure that you call for this appointment the day you arrive home to insure a convenient appointment time.      10. IF YOU HAVE DISABILITY OR FAMILY LEAVE FORMS, BRING THEM TO THE               OFFICE FOR PROCESSING.   WHEN TO CALL us [phone]:  1. Poor pain control 2. Reactions / problems with new medications (rash/itching, nausea, etc)  3. Fever over 101.5 F (38.5 C) 4. Inability to urinate 5. Nausea and/or vomiting 6. Worsening swelling or bruising 7. Continued bleeding from incision. 8. Increased pain, redness, or drainage from the incision  The clinic staff is available to answer your questions during regular business hours (8:30am-5pm). Please dont hesitate to call and ask to speak to one of our nurses for clinical concerns.  If you have a medical emergency, go to the nearest emergency room or call 911.  A surgeon from [facility] is always on call at the [facility], [place], [place], [place], [place] [address] ?  MAIN: [phone] ? TOLL FREE: [phone] ?  FAX [phone]  [email or web address]     Managing Your Pain After Surgery Without Opioids    Thank you for participating in our program to help patients manage their pain after surgery without opioids. This is part of our effort to provide you with the best care possible, without exposing you or your family to the risk that opioids  pose.  What pain can I expect after surgery? You can expect to have some pain after surgery. This is normal. The pain is typically worse the day after surgery, and quickly begins to get better. Many studies have found that many patients are able to manage their pain after surgery with Over-the-Counter (OTC) medications such as Tylenol and Motrin. If you have a condition that does not allow you to take Tylenol or Motrin, notify your surgical team.  How will I manage my pain? The best strategy for controlling your pain after surgery is around the clock pain control with Tylenol (acetaminophen) and Motrin (ibuprofen or Advil). Alternating these medications with each other allows you to maximize your pain control. In addition to Tylenol and Motrin, you can use heating pads or ice packs on your incisions to help reduce your pain.  How will I alternate your regular strength over-the-counter pain medication? You will take a dose of pain medication every three hours. ; Start by taking 650 mg of Tylenol (2 pills of 325 mg) ; 3 hours later take 600 mg of Motrin (3 pills of 200 mg) ;  3 hours after taking the Motrin take 650 mg of Tylenol ; 3 hours after that take 600 mg of Motrin.   - 1 -  See example - if your first dose of Tylenol is at 12:00 PM   12:00 PM Tylenol 650 mg (2 pills of 325 mg)  3:00 PM Motrin 600 mg (3 pills of 200 mg)  6:00 PM Tylenol 650 mg (2 pills of 325 mg)  9:00 PM Motrin 600 mg (3 pills of 200 mg)  Continue alternating every 3 hours   We recommend that you follow this schedule around-the-clock for at least 3 days after surgery, or until you feel that it is no longer needed. Use the table on the last page of this handout to keep track of the medications you are taking. Important: Do not take more than 3000mg  of Tylenol or 3200mg  of Motrin in a 24-hour period. Do not take ibuprofen/Motrin if you have a history of bleeding stomach ulcers, severe kidney disease, &/or actively  taking a blood thinner  What if I still have pain? If you have pain that is not controlled with the over-the-counter pain medications (Tylenol and Motrin or Advil) you might have what we call breakthrough pain. You will receive a prescription for a small amount of an opioid pain medication such as Oxycodone, Tramadol, or Tylenol with Codeine. Use these opioid pills in the first 24 hours after surgery if you have breakthrough pain. Do not take more than 1 pill every 4-6 hours.  If you still have uncontrolled pain after using all opioid pills, don't hesitate to call our staff using the number provided. We will help make sure you are managing your pain in the best way possible, and if necessary, we can provide a prescription for additional pain medication.   Day 1    Time  Name of Medication Number of pills taken  Amount of Acetaminophen  Pain Level   Comments  AM PM       AM PM       AM PM       AM PM       AM PM       AM PM       AM PM       AM PM       Total Daily amount of Acetaminophen Do not take more than  3,000 mg per day      Day 2    Time  Name of Medication Number of pills taken  Amount of Acetaminophen  Pain Level   Comments  AM PM       AM PM       AM PM       AM PM       AM PM       AM PM       AM PM       AM PM       Total Daily amount of Acetaminophen Do not take more than  3,000 mg per day      Day 3    Time  Name of Medication Number of pills taken  Amount of Acetaminophen  Pain Level   Comments  AM PM       AM PM       AM PM       AM PM          AM PM       AM PM  AM PM       AM PM       Total Daily amount of Acetaminophen Do not take more than  3,000 mg per day      Day 4    Time  Name of Medication Number of pills taken  Amount of Acetaminophen  Pain Level   Comments  AM PM       AM PM       AM PM       AM PM       AM PM       AM PM       AM PM       AM PM       Total Daily amount of Acetaminophen Do not  take more than  3,000 mg per day      Day 5    Time  Name of Medication Number of pills taken  Amount of Acetaminophen  Pain Level   Comments  AM PM       AM PM       AM PM       AM PM       AM PM       AM PM       AM PM       AM PM       Total Daily amount of Acetaminophen Do not take more than  3,000 mg per day       Day 6    Time  Name of Medication Number of pills taken  Amount of Acetaminophen  Pain Level  Comments  AM PM       AM PM       AM PM       AM PM       AM PM       AM PM       AM PM       AM PM       Total Daily amount of Acetaminophen Do not take more than  3,000 mg per day      Day 7    Time  Name of Medication Number of pills taken  Amount of Acetaminophen  Pain Level   Comments  AM PM       AM PM       AM PM       AM PM       AM PM       AM PM       AM PM       AM PM       Total Daily amount of Acetaminophen Do not take more than  3,000 mg per day        For additional information about how and where to safely dispose of unused opioid medications - [employer]  Disclaimer: This document contains information and/or instructional materials adapted from [place] for the typical patient with your condition. It does not replace medical advice from your health care provider because your experience may differ from that of the typical patient. Talk to your health care provider if you have any questions about this document, your condition or your treatment plan. Adapted from [place]

## 2019-02-17 LAB — CULTURE, BLOOD (ROUTINE X 2): Culture: NO GROWTH

## 2019-02-18 LAB — CULTURE, BLOOD (ROUTINE X 2): Culture: NO GROWTH

## 2019-02-21 NOTE — Discharge Summary (Addendum)
Patient ID: Susan Moreno [id number] y.o.  Admit date: 02/12/2019 Discharge date: 02/14/2019  Admitting Diagnosis: Acute appendicitis  Discharge Diagnosis Patient Active Problem List   Diagnosis Date Noted   Appendicitis with perforation 02/12/2019    Consultants none  Reason for Admission: This is a very pleasant 73 year old woman who presented to [place] with several days of right-sided abdominal pain, nausea, vomiting, fever and chills.  Symptoms began on Saturday with pain on the right side radiating somewhat around to the back; she felt that she had pulled a muscle.  On Sunday however she developed nausea and spent much of the day vomiting.  Began to have fevers and fatigue.  On evaluation at [facility] this evening (day 4 of symptoms) she was found to have a leukocytosis to 17.9 and a CT scan was completed which was notable for perforated appendicitis with a very small abscess and inflammatory change/fluid/air tracking up the right side towards the liver.   Procedures Dr. Lucia Gaskins, 02/13/19  Laparoscopic appendectomy  Hospital Course:  The patient was admitted and underwent a laparoscopic appendectomy for a perforated appendicitis.  The patient tolerated the procedure well.  On POD 1, the patient was tolerating a regular diet, voiding well, mobilizing, and pain was controlled with oral pain medications.  The patient was stable for DC home at this time with appropriate follow up made as well as 5 more days of abx therapy.     Physical Exam: See progress note from day of discharge  Allergies as of 02/14/2019       Reactions   Penicillins Itching        Medication List     TAKE these medications    acetaminophen 325 MG tablet Commonly known as: TYLENOL Take 2 tablets (650 mg total) by mouth every 4 (four) hours as needed for mild pain (or temp > 100).   calcium carbonate 1250 (500 Ca) MG tablet Commonly known as: OS-CAL -  dosed in mg of elemental calcium Take 1 tablet by mouth daily with breakfast.   dorzolamide-timolol 22.3-6.8 MG/ML ophthalmic solution Commonly known as: COSOPT Place 1 drop into both eyes 2 (two) times daily.   escitalopram 10 MG tablet Commonly known as: LEXAPRO Take 10 mg by mouth daily.   Fluzone High-Dose 0.5 ML injection Generic drug: Influenza vac split quadrivalent PF inject 0.5 milliliter intramuscularly   latanoprost 0.005 % ophthalmic solution Commonly known as: XALATAN Place 1 drop into both eyes at bedtime.   oxyCODONE 5 MG immediate release tablet Commonly known as: Oxy IR/ROXICODONE Take 1-2 tablets (5-10 mg total) by mouth every 4 (four) hours as needed for moderate pain.   pravastatin 40 MG tablet Commonly known as: PRAVACHOL Take 40 mg by mouth daily.   senna 8.6 MG tablet Commonly known as: SENOKOT Take 1 tablet by mouth daily.   traZODone 100 MG tablet Commonly known as: DESYREL Take 100 mg by mouth at bedtime as needed for sleep.       ASK your doctor about these medications    ciprofloxacin 500 MG tablet Commonly known as: CIPRO Take 1 tablet (500 mg total) by mouth 2 (two) times daily for 5 days. Ask about: Should I take this medication?   metroNIDAZOLE 500 MG tablet Commonly known as: FLAGYL Take 1 tablet (500 mg total) by mouth every 8 (eight) hours for 5 days. Ask about: Should I take this medication?  Follow-up [facility] Follow up on 02/28/2019.   Specialty: General Surgery Why: at 2pm a provider will call you. Please take a photo of your incisions a few days prior and send to [email or web address] and put your date of birth and name in subject line.  Contact information: [address] [place] [phone]           Signed: Saverio Danker, [facility] 02/21/2019, 11:24 AM Please see Amion for pager  number during day hours 7:00am-4:30pm  Agree with above.  Alphonsa Overall, MD, [facility] Office phone:  [phone]

## 2020-03-19 ENCOUNTER — Ambulatory Visit: Payer: Medicare Other | Attending: Family Medicine | Admitting: Physical Therapy

## 2020-03-19 ENCOUNTER — Encounter: Payer: Self-pay | Admitting: Physical Therapy

## 2020-03-19 ENCOUNTER — Other Ambulatory Visit: Payer: Self-pay

## 2020-03-19 DIAGNOSIS — R2689 Other abnormalities of gait and mobility: Secondary | ICD-10-CM | POA: Diagnosis not present

## 2020-03-19 DIAGNOSIS — M6281 Muscle weakness (generalized): Secondary | ICD-10-CM | POA: Diagnosis not present

## 2020-03-19 NOTE — Patient Instructions (Signed)
Access Code: [email or web address] URL: [email or web address] Date: 03/19/2020 Prepared by: Lysle Rubens  Exercises Sit to Stand without Arm Support - 1 x daily - 7 x weekly - 3 sets - 10 reps Standing Hip Abduction with Counter Support - 1 x daily - 7 x weekly - 3 sets - 10 reps Standing Hip Extension with Counter Support - 1 x daily - 7 x weekly - 3 sets - 10 reps Heel rises with counter support - 1 x daily - 7 x weekly - 3 sets - 10 reps Standing Shoulder Row with Anchored Resistance - 1 x daily - 7 x weekly - 3 sets - 10 reps Shoulder extension with resistance - Neutral - 1 x daily - 7 x weekly - 3 sets - 10 reps Seated Hamstring Stretch - 1 x daily - 7 x weekly - 3 sets - 2 reps - 20-30 sec hold

## 2020-03-19 NOTE — Therapy (Signed)
[place] [place], [place], [address] Phone: [phone]   Fax:  [phone]  Physical Therapy Evaluation  Patient Details  Name: Susan Moreno MRN: [id number] Date of Birth: 03/01/1946 Referring Provider (PT): Dema Severin   Encounter Date: 03/19/2020   PT End of Session - 03/19/20 1514    Visit Number 1    Date for PT Re-Evaluation 05/17/20    PT Start Time 1433    PT Stop Time 1508    PT Time Calculation (min) 35 min    Activity Tolerance Patient tolerated treatment well    Behavior During Therapy [facility] for tasks assessed/performed           Past Medical History:  Diagnosis Date  . PONV (postoperative nausea and vomiting)     Past Surgical History:  Procedure Laterality Date  . ABDOMINAL HYSTERECTOMY    . CARPAL TUNNEL RELEASE Right 10/30/2014   Procedure: CARPAL TUNNEL RELEASE RIGHT;  Surgeon: Daryll Brod, MD;  Location: [place];  Service: Orthopedics;  Laterality: Right;  ANESTHESIA: IV REGIONAL FAB  . LAPAROSCOPIC APPENDECTOMY N/A 02/13/2019   Procedure: APPENDECTOMY LAPAROSCOPIC OF RUPTURED APPENDIX;  Surgeon: Alphonsa Overall, MD;  Location: [facility];  Service: General;  Laterality: N/A;  . TONSILLECTOMY      There were no vitals filed for this visit.    Subjective Assessment - 03/19/20 1431    Subjective Pt reports that she has age-related osteoporosis and it was suggested to her that she have a round of physical therapy for strengthening. Pt reports that she has mild L ankle and R knee pain, but not severe. Pt denies falls or trouble with balance. Pt reports that she has trouble getting off the floor without using furniture to push herself back up; would like to work on this.    Pertinent History osteoporosis    Limitations Other (comment)   mobility getting up from low surfaces   Diagnostic tests none    Patient Stated Goals improve LE strength    Currently in Pain? No/denies               [facility] PT Assessment - 03/19/20 0001      Assessment   Medical Diagnosis osteoporosis    Referring Provider (PT) White    Prior Therapy PT for R knee      Precautions   Precautions None      Restrictions   Weight Bearing Restrictions No      Balance Screen   Has the patient fallen in the past 6 months No    Has the patient had a decrease in activity level because of a fear of falling?  No    Is the patient reluctant to leave their home because of a fear of falling?  No      Home Environment   Additional Comments some housework, yardwork. reports no trouble with stairs      Prior Function   Level of Independence Independent    Vocation Retired    Leisure walking 30 min 3x/week, gardening      Functional Tests   Functional tests Sit to Stand;Floor to Stand      Sit to Stand   Comments WFL      Floor to Stand   Comments reports she needs something to push off of to rise from floor to standing      ROM / Strength   AROM / PROM / Strength AROM;Strength  AROM   Overall AROM Comments lumbar flexion 25% limited d/t tight hamstrings, otherwise lumbar AROM WFL; R shoulder functional IR mildly limited, otherwise BUE ROM WFL      Strength   Overall Strength Comments 5/5 BLE MMT      Flexibility   Soft Tissue Assessment /Muscle Length yes    Hamstrings tight    Quadriceps tight      Transfers   Five time sit to stand comments  WFL <12 sec no UE support      Ambulation/Gait   Gait Comments gait WFL; stairs WFL no UE and reciprical pattern      Balance   Balance Assessed Yes      Standardized Balance Assessment   Standardized Balance Assessment Berg Balance Test;Five Times Sit to Stand;Timed Up and Go Test    Five times sit to stand comments  < 12 sec      Berg Balance Test   Sit to Stand Able to stand without using hands and stabilize independently    Standing Unsupported Able to stand safely 2 minutes    Sitting with Back Unsupported but Feet Supported  on Floor or Stool Able to sit safely and securely 2 minutes    Stand to Sit Sits safely with minimal use of hands    Transfers Able to transfer safely, minor use of hands    Standing Unsupported with Eyes Closed Able to stand 10 seconds safely    Standing Unsupported with Feet Together Able to place feet together independently and stand 1 minute safely    From Standing, Reach Forward with Outstretched Arm Can reach forward >12 cm safely (5")    From Standing Position, Pick up Object from Floor Able to pick up shoe safely and easily    From Standing Position, Turn to Look Behind Over each Shoulder Looks behind from both sides and weight shifts well    Turn 360 Degrees Able to turn 360 degrees safely in 4 seconds or less    Standing Unsupported, Alternately Place Feet on Step/Stool Able to stand independently and safely and complete 8 steps in 20 seconds    Standing Unsupported, One Foot in Front Able to take small step independently and hold 30 seconds    Standing on One Leg Able to lift leg independently and hold equal to or more than 3 seconds    Total Score 51      Timed Up and Go Test   Manual TUG (seconds) 11    TUG Comments no AD                      Objective measurements completed on examination: See above findings.       [facility] Adult PT Treatment/Exercise - 03/19/20 0001      Exercises   Exercises Knee/Hip;Lumbar      Lumbar Exercises: Stretches   Active Hamstring Stretch Right;Left;1 rep;20 seconds      Lumbar Exercises: Standing   Row Both;10 reps;Theraband    Theraband Level (Row) Level 2 (Red)    Shoulder Extension Both;10 reps;Theraband    Theraband Level (Shoulder Extension) Level 2 (Red)      Knee/Hip Exercises: Standing   Heel Raises Both;1 set;10 reps    Hip Abduction Both;1 set;10 reps    Hip Extension Both;1 set;10 reps      Knee/Hip Exercises: Seated   Sit to Sand 1 set;10 reps;without UE support  PT Education -  03/19/20 1514    Education Details Pt educated on POC and HEP    Person(s) Educated Patient    Methods Explanation;Demonstration;Handout    Comprehension Verbalized understanding;Returned demonstration            PT Short Term Goals - 03/19/20 1532      PT SHORT TERM GOAL #1   Title Pt will be I with initial HEP    Time 2    Period Weeks    Status New    Target Date 04/02/20             PT Long Term Goals - 03/19/20 1532      PT LONG TERM GOAL #1   Title Pt will be I with advanced HEP    Time 6    Period Weeks    Status New    Target Date 04/30/20      PT LONG TERM GOAL #2   Title Pt will demo able to rise from floor to standing position independently    Time 6    Period Weeks    Status New    Target Date 04/30/20      PT LONG TERM GOAL #3   Title Pt will demo understanding of functional strenghtening and management of osteoporosis    Time 6    Period Weeks    Status New    Target Date 04/30/20                  Plan - 03/19/20 1515    Clinical Impression Statement Pt presents to clinic with report of recent dx of age-related osteoporosis. Pt hesistant to take meds so recommended for PT to get general strengthening/stability ex's and obtain baseline strength/balance measures. No significant pain reported. Pt demos good functional LE strength, BLE/UE MMT WFL, and no elevated risk of falls per Berg/TUG/5x STS. Pt does demo some tightness of B hamstrings limiting lumbar ROM, mild core instability, and trouble with tandem stance for balance. Issued HEP to address deficits; pt will plan to come back in 2 weeks for progression of HEP. Education on importance of exercise and continuance of walking program in management of osteoporosis with pt VU and agreement.    Personal Factors and Comorbidities Comorbidity 1    Comorbidities osteoporosis    Examination-Activity Limitations Squat;Bend    Stability/Clinical Decision Making Stable/Uncomplicated    Clinical  Decision Making Low    Rehab Potential Good    PT Frequency Biweekly   every other week   PT Duration 6 weeks    PT Treatment/Interventions ADLs/Self Care Home Management;Functional mobility training;Therapeutic activities;Therapeutic exercise;Balance training;Patient/family education    PT Next Visit Plan progress HEP, functional strengthening (floor to stand training), high level balance ex's    PT Home Exercise Plan see pt instructions    Consulted and Agree with Plan of Care Patient           Patient will benefit from skilled therapeutic intervention in order to improve the following deficits and impairments:  Decreased range of motion,Decreased balance,Impaired flexibility  Visit Diagnosis: Muscle weakness (generalized)  Balance problems     Problem List Patient Active Problem List   Diagnosis Date Noted  . Appendicitis with perforation 02/12/2019   Lysle Rubens, PT, DPT Maryanna Shape Edlin Ford 03/19/2020, 3:36 PM  [facility] [address] [place], [place], [address] Phone: [phone]   Fax:  [phone]  Name: EILISH MCDANIEL MRN:  [id number] Date of Birth: 05-21-1945

## 2020-04-02 ENCOUNTER — Other Ambulatory Visit: Payer: Self-pay

## 2020-04-02 ENCOUNTER — Encounter: Payer: Self-pay | Admitting: Physical Therapy

## 2020-04-02 ENCOUNTER — Ambulatory Visit: Payer: Medicare Other | Admitting: Physical Therapy

## 2020-04-02 DIAGNOSIS — R2689 Other abnormalities of gait and mobility: Secondary | ICD-10-CM

## 2020-04-02 DIAGNOSIS — M6281 Muscle weakness (generalized): Secondary | ICD-10-CM

## 2020-04-02 NOTE — Therapy (Signed)
[address]. [place], [place], [address] Phone: [phone]   Fax:  [phone]  Physical Therapy Treatment  Patient Details  Name: Susan Moreno MRN: [id number] Date of Birth: Nov 02, 1945 Referring Provider (PT): Dema Severin   Encounter Date: 04/02/2020   PT End of Session - 04/02/20 1522    Visit Number 2    Date for PT Re-Evaluation 05/17/20    PT Start Time 1440    PT Stop Time 1520    PT Time Calculation (min) 40 min    Activity Tolerance Patient tolerated treatment well    Behavior During Therapy [facility] for tasks assessed/performed           Past Medical History:  Diagnosis Date  . PONV (postoperative nausea and vomiting)     Past Surgical History:  Procedure Laterality Date  . ABDOMINAL HYSTERECTOMY    . CARPAL TUNNEL RELEASE Right 10/30/2014   Procedure: CARPAL TUNNEL RELEASE RIGHT;  Surgeon: Daryll Brod, MD;  Location: [place];  Service: Orthopedics;  Laterality: Right;  ANESTHESIA: IV REGIONAL FAB  . LAPAROSCOPIC APPENDECTOMY N/A 02/13/2019   Procedure: APPENDECTOMY LAPAROSCOPIC OF RUPTURED APPENDIX;  Surgeon: Alphonsa Overall, MD;  Location: [facility];  Service: General;  Laterality: N/A;  . TONSILLECTOMY      There were no vitals filed for this visit.   Subjective Assessment - 04/02/20 1459    Subjective Pt states that she has been doing exercises; feels good with no pain. Feels confident in ability to continue with exercises on her own.    Currently in Pain? No/denies                             [facility] Adult PT Treatment/Exercise - 04/02/20 0001      High Level Balance   High Level Balance Activities Side stepping;Backward walking;Tandem walking    High Level Balance Comments side stepping on foam      Lumbar Exercises: Aerobic   UBE (Upper Arm Bike) L1 3 min each    Nustep L4 x 6 min      Lumbar Exercises: Standing   Heel Raises 15 reps    Other Standing  Lumbar Exercises alt step taps 6# 2x10B      Lumbar Exercises: Seated   Sit to Stand 20 reps    Sit to Stand Limitations with 4# dumbbell chest press    Other Seated Lumbar Exercises rows and lats 15# 2x10                    PT Short Term Goals - 04/02/20 1525      PT SHORT TERM GOAL #1   Title Pt will be I with initial HEP    Time 2    Period Weeks    Status Achieved    Target Date 04/02/20             PT Long Term Goals - 04/02/20 1525      PT LONG TERM GOAL #1   Title Pt will be I with advanced HEP    Time 6    Period Weeks    Status Partially Met      PT LONG TERM GOAL #2   Title Pt will demo able to rise from floor to standing position independently    Time 6    Period Weeks    Status On-going  PT LONG TERM GOAL #3   Title Pt will demo understanding of functional strenghtening and management of osteoporosis    Time 6    Period Weeks    Status Achieved                 Plan - 04/02/20 1522    Clinical Impression Statement Pt demos good functional strength and balance this rx. Pt reports consistently doing HEP 3x/week; not walking as much recently d/t weather but typically walks regularly. Pt demos tolerance to machine and functional strengthening interventions with no reports of increased pain this rx. Pt able to perform high level balance ex's with no LOB or able to self correct utilizing ankle strategy. Education on management of osteoporosis with exercise with pt VU. Discussed putting pt on hold to continue HEP independently and pt will call back if further instruction/progression of HEP required with pt VU and agreement.    PT Treatment/Interventions ADLs/Self Care Home Management;Functional mobility training;Therapeutic activities;Therapeutic exercise;Balance training;Patient/family education    PT Next Visit Plan progress HEP, functional strengthening (floor to stand training), high level balance ex's    Consulted and Agree with Plan of  Care Patient           Patient will benefit from skilled therapeutic intervention in order to improve the following deficits and impairments:  Decreased range of motion,Decreased balance,Impaired flexibility  Visit Diagnosis: Muscle weakness (generalized)  Balance problems     Problem List Patient Active Problem List   Diagnosis Date Noted  . Appendicitis with perforation 02/12/2019   Susan Moreno, PT, DPT Susan Moreno 04/02/2020, 3:26 PM  [place]. [place], [place], [address] Phone: [phone]   Fax:  [phone]  Name: Susan Moreno MRN: [id number] Date of Birth: 1946-02-08

## 2020-05-21 DIAGNOSIS — H401132 Primary open-angle glaucoma, bilateral, moderate stage: Secondary | ICD-10-CM | POA: Diagnosis not present

## 2020-05-25 DIAGNOSIS — H401132 Primary open-angle glaucoma, bilateral, moderate stage: Secondary | ICD-10-CM | POA: Diagnosis not present

## 2020-05-25 DIAGNOSIS — H5213 Myopia, bilateral: Secondary | ICD-10-CM | POA: Diagnosis not present

## 2020-05-25 DIAGNOSIS — H26491 Other secondary cataract, right eye: Secondary | ICD-10-CM | POA: Diagnosis not present

## 2020-05-25 DIAGNOSIS — H04123 Dry eye syndrome of bilateral lacrimal glands: Secondary | ICD-10-CM | POA: Diagnosis not present

## 2020-05-26 DIAGNOSIS — Z01812 Encounter for preprocedural laboratory examination: Secondary | ICD-10-CM | POA: Diagnosis not present

## 2020-05-29 DIAGNOSIS — K648 Other hemorrhoids: Secondary | ICD-10-CM | POA: Diagnosis not present

## 2020-05-29 DIAGNOSIS — D123 Benign neoplasm of transverse colon: Secondary | ICD-10-CM | POA: Diagnosis not present

## 2020-05-29 DIAGNOSIS — Z86010 Personal history of colonic polyps: Secondary | ICD-10-CM | POA: Diagnosis not present

## 2020-05-29 DIAGNOSIS — K5730 Diverticulosis of large intestine without perforation or abscess without bleeding: Secondary | ICD-10-CM | POA: Diagnosis not present

## 2020-05-29 DIAGNOSIS — D122 Benign neoplasm of ascending colon: Secondary | ICD-10-CM | POA: Diagnosis not present

## 2020-06-02 DIAGNOSIS — D122 Benign neoplasm of ascending colon: Secondary | ICD-10-CM | POA: Diagnosis not present

## 2020-06-02 DIAGNOSIS — D123 Benign neoplasm of transverse colon: Secondary | ICD-10-CM | POA: Diagnosis not present

## 2020-06-03 DIAGNOSIS — M810 Age-related osteoporosis without current pathological fracture: Secondary | ICD-10-CM | POA: Diagnosis not present

## 2020-06-03 DIAGNOSIS — F5101 Primary insomnia: Secondary | ICD-10-CM | POA: Diagnosis not present

## 2020-06-23 DIAGNOSIS — H26491 Other secondary cataract, right eye: Secondary | ICD-10-CM | POA: Diagnosis not present

## 2020-08-25 DIAGNOSIS — H0011 Chalazion right upper eyelid: Secondary | ICD-10-CM | POA: Diagnosis not present

## 2020-10-19 DIAGNOSIS — H0011 Chalazion right upper eyelid: Secondary | ICD-10-CM | POA: Diagnosis not present

## 2020-10-19 DIAGNOSIS — H401131 Primary open-angle glaucoma, bilateral, mild stage: Secondary | ICD-10-CM | POA: Diagnosis not present

## 2020-11-29 IMAGING — CT CT ABD-PELV W/ CM
2 of 5 series · 15 of 46 positions shown, 17 images · IV contrast (Omnipaque)
Comparison: None.

CLINICAL DATA: Right-sided abdominal pain, right upper quadrant and
right mid abdomen. Nausea and vomiting. Decreased bowel movement.
Fever, chills, tachycardia.

EXAM:
CT ABDOMEN AND PELVIS WITH CONTRAST
TECHNIQUE: Multidetector CT imaging of the abdomen and pelvis was performed
using the standard protocol following bolus administration of
intravenous contrast.
CONTRAST:  100mL OMNIPAQUE IOHEXOL 300 MG/ML  SOLN

[Series 2: axial st · axial · 0.84mm/px · z∈[-747,-327]mm · 12 of 96 slices shown, 14 images]
[im 6/96  soft-tissue]
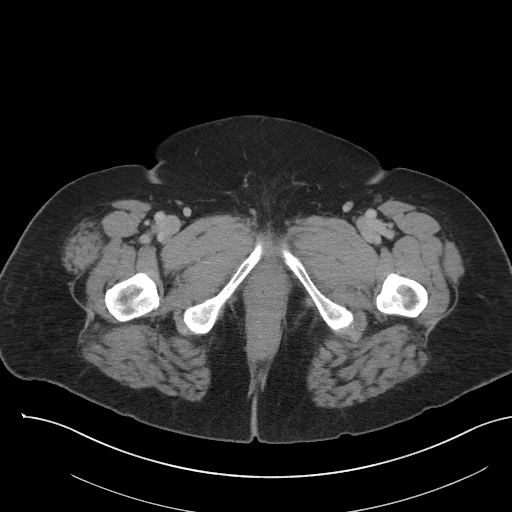
[im 6/96  bone]
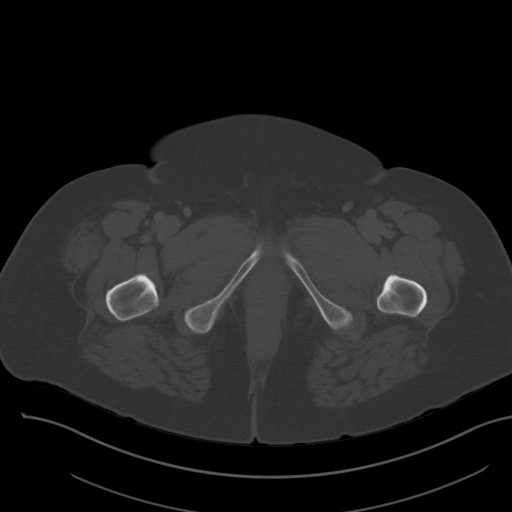
[im 16/96  soft-tissue]
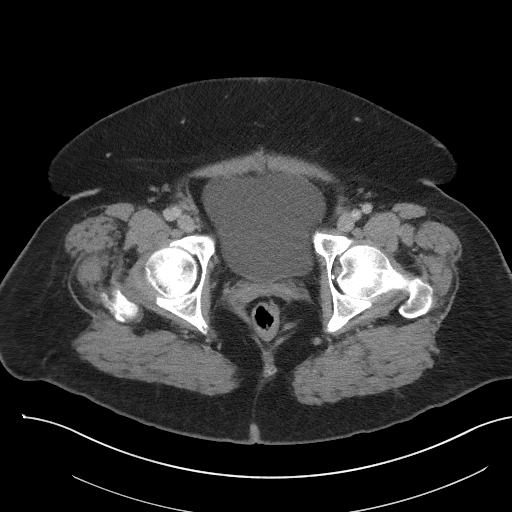
[im 22/96  soft-tissue]
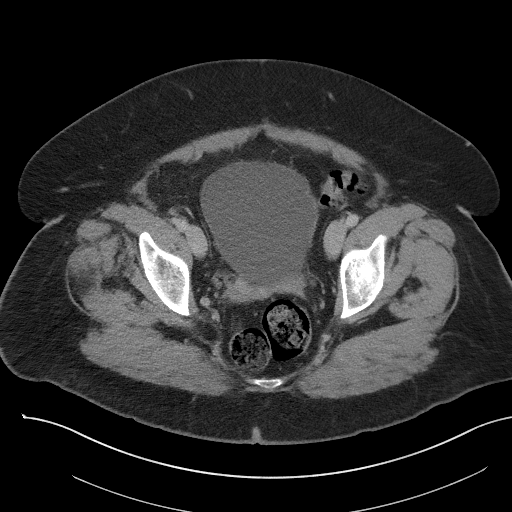
[im 27/96  soft-tissue]
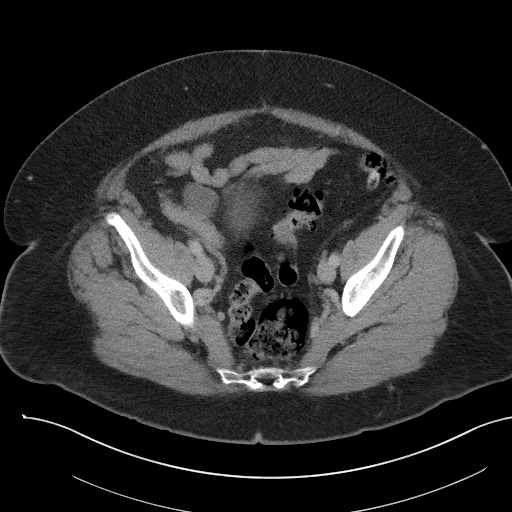
[im 37/96  soft-tissue]
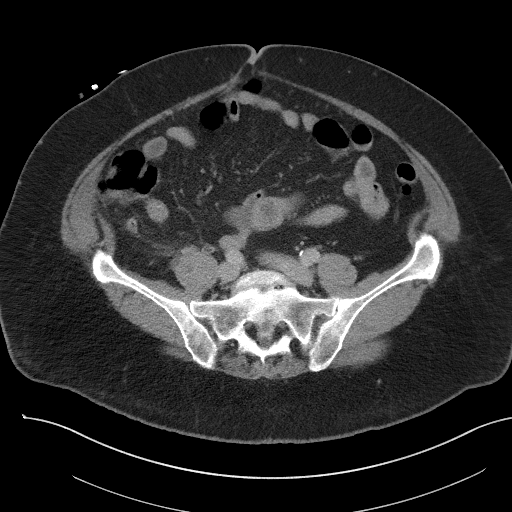
[im 43/96  soft-tissue]
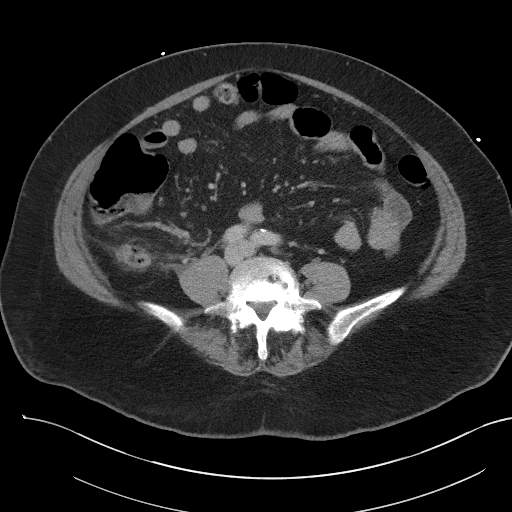
[im 53/96  soft-tissue]
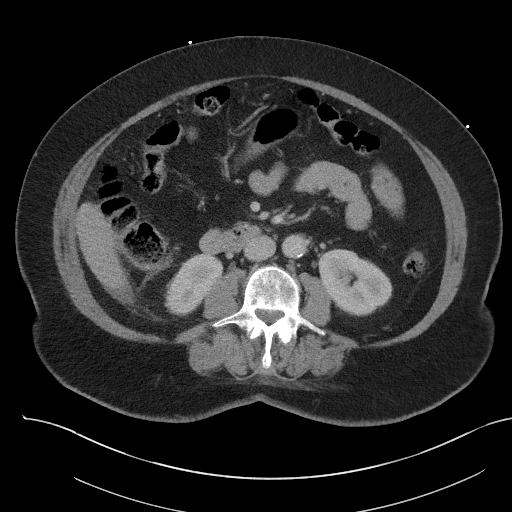
[im 59/96  soft-tissue]
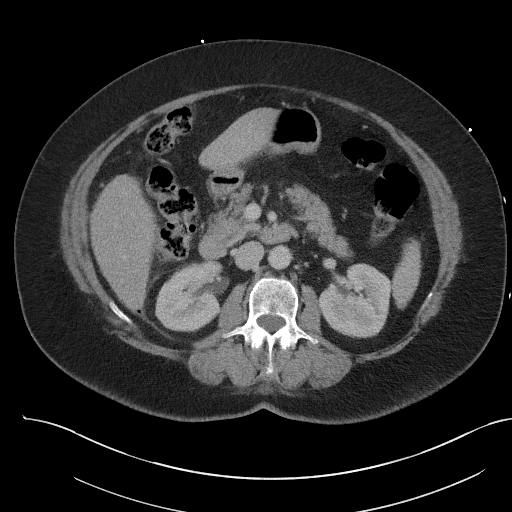
[im 69/96  soft-tissue]
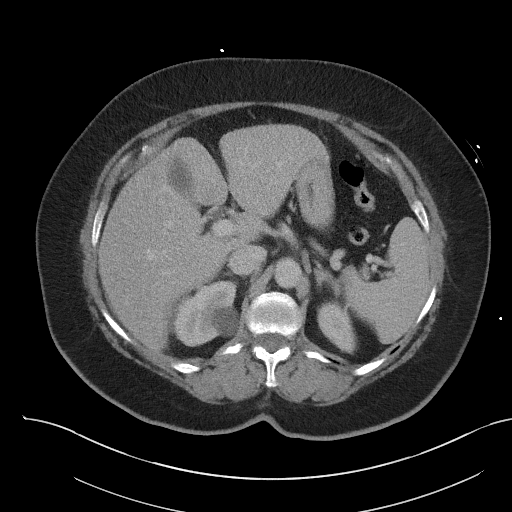
[im 69/96  bone]
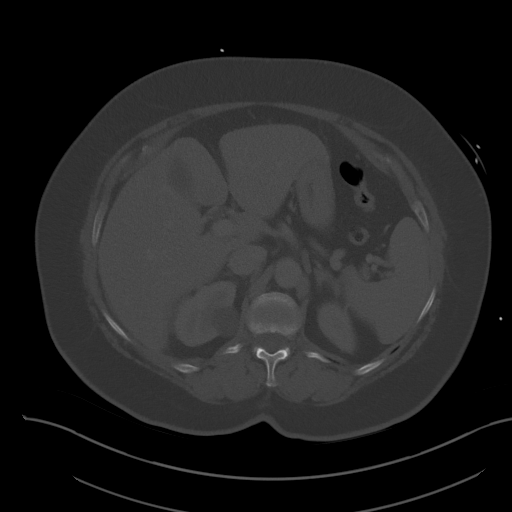
[im 74/96  soft-tissue]
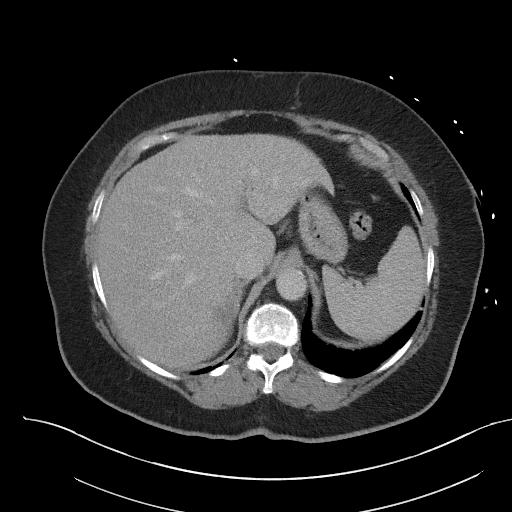
[im 80/96  soft-tissue]
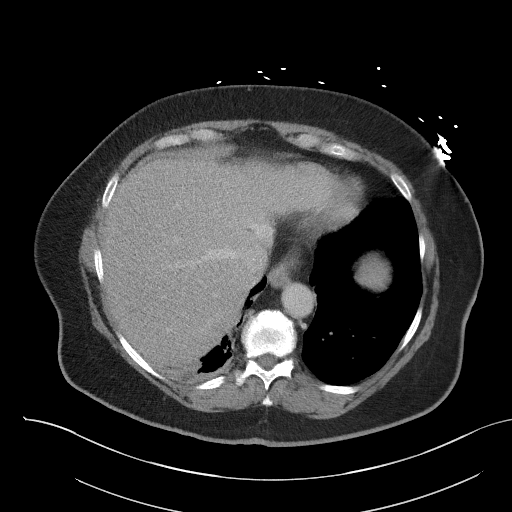
[im 90/96  soft-tissue]
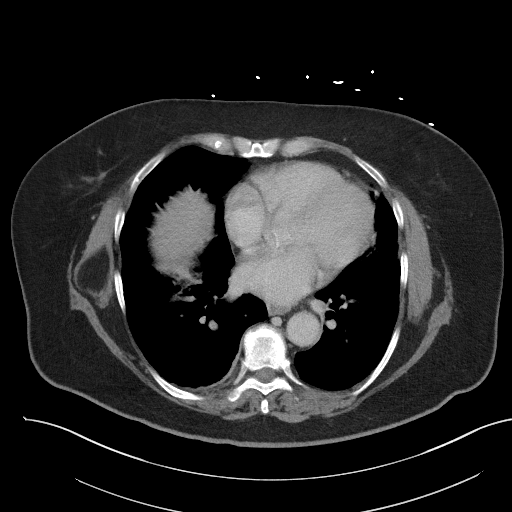

[Series 5: coronal st · coronal · 0.77mm/px · 3 of 115 slices shown]
[im 39/115  soft-tissue]
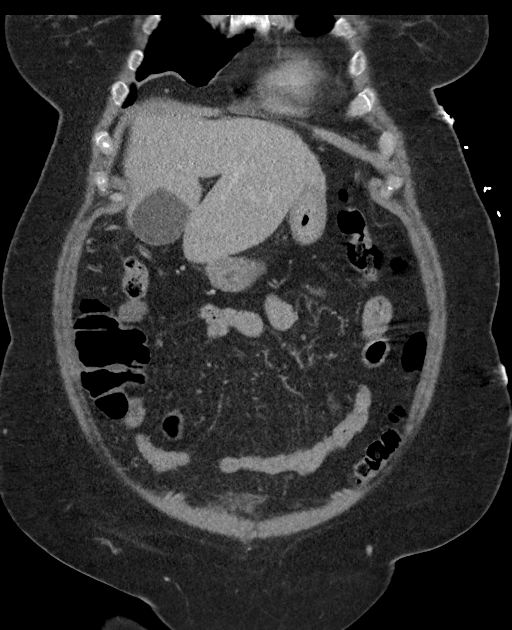
[im 51/115  soft-tissue]
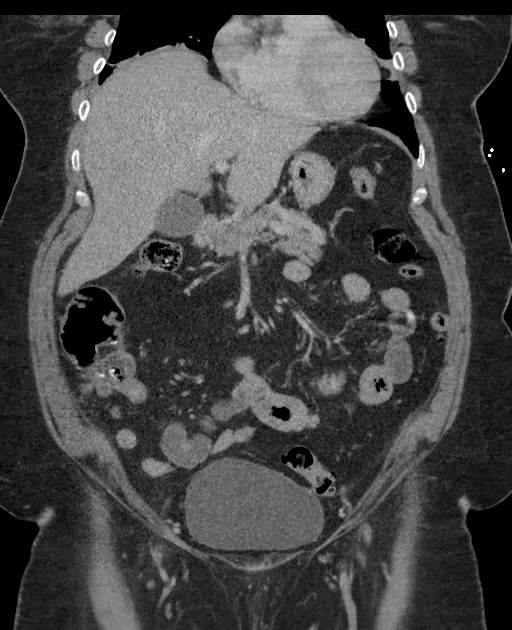
[im 64/115  soft-tissue]
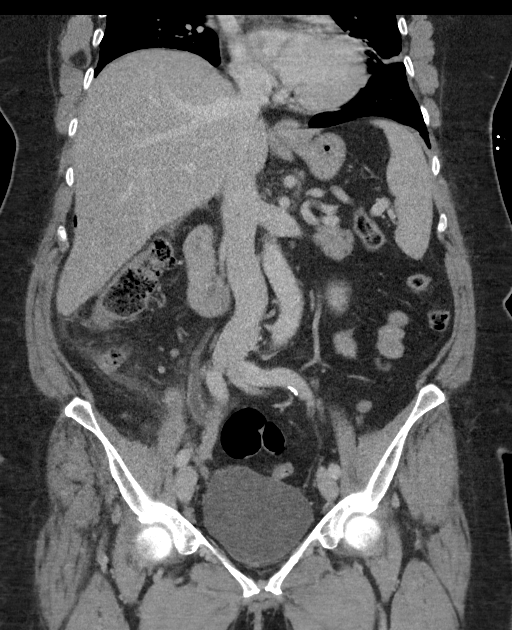

[15 of 46 positions shown; findings below may reference images not displayed]

FINDINGS: Lower chest: Trace right pleural effusion. Right lower and middle
lobe atelectasis. Heart is normal in size.

Hepatobiliary: 6 mm hypodensity in the subcapsular right lobe of the
liver, too small to accurately characterize. Borderline hepatic
steatosis. Gallbladder is distended without calcified gallstone. No
pericholecystic inflammation. Common bile duct measures 7 mm, upper
normal.

Pancreas: No ductal dilatation or inflammation.

Spleen: Normal in size without focal abnormality.

Adrenals/Urinary Tract: No adrenal nodule. No hydronephrosis or
perinephric edema. Homogeneous renal enhancement with symmetric
excretion on delayed phase imaging. Simple cyst in the upper right
kidney measures 2.4 cm. Urinary bladder is physiologically distended
without wall thickening.

Stomach/Bowel: Acute appendicitis as described below. Small hiatal
hernia. Stomach is decompressed. No small bowel obstruction or
inflammation, fluid-filled prominent small bowel in the right lower
quadrant likely reactive. Moderate volume of stool throughout the
colon. Distal colonic diverticulosis without diverticulitis.

Appendix: Location: Retrocecal

Diameter: 10 mm

Appendicolith: No

Mucosal hyper-enhancement: Yes

Extraluminal gas: Yes, and tracking up into the right upper quadrant
and abutting the inferior liver tip.

Periappendiceal collection: Possibly, pancreatic tail is indistinct
with surrounding inflammation. There is ill-defined soft tissue
density adjacent to the appendiceal tip, series 2, image 53, without
well-defined collection. Inflammatory change tracts into the right
upper quadrant with small air-fluid collection inferior to the right
lobe of the liver measuring 18 x 17 x 15 mm, series 2 image 45.
Additional foci of air and fluid track in the perihepatic space in
the right upper quadrant.

Vascular/Lymphatic: Aortic atherosclerosis. No aneurysm. The portal
vein and mesenteric vessels are patent. Few prominent ileocolic
nodes are likely reactive.

Reproductive: Status post hysterectomy. No adnexal masses.

Other: Perforated appendicitis with air and fluid tracking into the
right upper quadrant adjacent to the right lobe of the liver. Tiny
fat containing umbilical hernia.

Musculoskeletal: Advanced degenerative change in the lower lumbar
spine. There are no acute or suspicious osseous abnormalities.

Critical Value/emergent results were called by telephone at the time
of interpretation on 02/12/2019 at [DATE] to Yarta Shidan
TIGER , who verbally acknowledged these results.
IMPRESSION: Perforated appendicitis with air and fluid tracking into the right
upper quadrant and abutting the liver. Small abscess inferior to the
liver tip measures 18 x 17 x 15 mm.

Colonic diverticulosis without diverticulitis.

Aortic Atherosclerosis (L8AT3-TTL.L).

## 2020-12-08 DIAGNOSIS — M7662 Achilles tendinitis, left leg: Secondary | ICD-10-CM | POA: Diagnosis not present

## 2020-12-08 DIAGNOSIS — M810 Age-related osteoporosis without current pathological fracture: Secondary | ICD-10-CM | POA: Diagnosis not present

## 2020-12-08 DIAGNOSIS — F5101 Primary insomnia: Secondary | ICD-10-CM | POA: Diagnosis not present

## 2020-12-08 DIAGNOSIS — M722 Plantar fascial fibromatosis: Secondary | ICD-10-CM | POA: Diagnosis not present

## 2020-12-08 DIAGNOSIS — E782 Mixed hyperlipidemia: Secondary | ICD-10-CM | POA: Diagnosis not present

## 2020-12-08 DIAGNOSIS — Z0000 Encounter for general adult medical examination without abnormal findings: Secondary | ICD-10-CM | POA: Diagnosis not present

## 2021-01-14 DIAGNOSIS — M25572 Pain in left ankle and joints of left foot: Secondary | ICD-10-CM | POA: Diagnosis not present

## 2021-01-18 ENCOUNTER — Other Ambulatory Visit: Payer: Self-pay | Admitting: Sports Medicine

## 2021-01-18 ENCOUNTER — Ambulatory Visit
Admission: RE | Admit: 2021-01-18 | Discharge: 2021-01-18 | Disposition: A | Discharge status: Home / Self Care | Payer: Medicare Other | Source: Ambulatory Visit | Attending: Sports Medicine | Admitting: Sports Medicine

## 2021-01-18 DIAGNOSIS — M7989 Other specified soft tissue disorders: Secondary | ICD-10-CM | POA: Diagnosis not present

## 2021-01-18 DIAGNOSIS — M79672 Pain in left foot: Secondary | ICD-10-CM | POA: Diagnosis not present

## 2021-01-27 ENCOUNTER — Other Ambulatory Visit: Payer: Self-pay | Admitting: Sports Medicine

## 2021-01-27 ENCOUNTER — Ambulatory Visit
Admission: RE | Admit: 2021-01-27 | Discharge: 2021-01-27 | Disposition: A | Discharge status: Home / Self Care | Payer: Medicare Other | Source: Ambulatory Visit | Attending: Sports Medicine | Admitting: Sports Medicine

## 2021-01-27 DIAGNOSIS — M25572 Pain in left ankle and joints of left foot: Secondary | ICD-10-CM

## 2021-01-27 DIAGNOSIS — M7989 Other specified soft tissue disorders: Secondary | ICD-10-CM | POA: Diagnosis not present

## 2021-03-02 DIAGNOSIS — M25572 Pain in left ankle and joints of left foot: Secondary | ICD-10-CM | POA: Diagnosis not present

## 2021-03-24 DIAGNOSIS — E782 Mixed hyperlipidemia: Secondary | ICD-10-CM | POA: Diagnosis not present

## 2021-04-22 DIAGNOSIS — H401131 Primary open-angle glaucoma, bilateral, mild stage: Secondary | ICD-10-CM | POA: Diagnosis not present

## 2021-04-22 DIAGNOSIS — Z961 Presence of intraocular lens: Secondary | ICD-10-CM | POA: Diagnosis not present

## 2021-07-01 DIAGNOSIS — M25511 Pain in right shoulder: Secondary | ICD-10-CM | POA: Diagnosis not present

## 2021-07-01 DIAGNOSIS — M25572 Pain in left ankle and joints of left foot: Secondary | ICD-10-CM | POA: Diagnosis not present

## 2021-08-19 DIAGNOSIS — F5101 Primary insomnia: Secondary | ICD-10-CM | POA: Diagnosis not present

## 2021-10-18 DIAGNOSIS — H401131 Primary open-angle glaucoma, bilateral, mild stage: Secondary | ICD-10-CM | POA: Diagnosis not present

## 2021-12-21 DIAGNOSIS — E782 Mixed hyperlipidemia: Secondary | ICD-10-CM | POA: Diagnosis not present

## 2021-12-21 DIAGNOSIS — M810 Age-related osteoporosis without current pathological fracture: Secondary | ICD-10-CM | POA: Diagnosis not present

## 2021-12-21 DIAGNOSIS — Z23 Encounter for immunization: Secondary | ICD-10-CM | POA: Diagnosis not present

## 2021-12-21 DIAGNOSIS — Z0000 Encounter for general adult medical examination without abnormal findings: Secondary | ICD-10-CM | POA: Diagnosis not present

## 2021-12-21 DIAGNOSIS — F5101 Primary insomnia: Secondary | ICD-10-CM | POA: Diagnosis not present

## 2022-01-24 DIAGNOSIS — Z1231 Encounter for screening mammogram for malignant neoplasm of breast: Secondary | ICD-10-CM | POA: Diagnosis not present

## 2022-01-28 DIAGNOSIS — M8589 Other specified disorders of bone density and structure, multiple sites: Secondary | ICD-10-CM | POA: Diagnosis not present

## 2022-01-28 DIAGNOSIS — Z780 Asymptomatic menopausal state: Secondary | ICD-10-CM | POA: Diagnosis not present

## 2022-01-28 DIAGNOSIS — M810 Age-related osteoporosis without current pathological fracture: Secondary | ICD-10-CM | POA: Diagnosis not present

## 2022-04-21 DIAGNOSIS — H401131 Primary open-angle glaucoma, bilateral, mild stage: Secondary | ICD-10-CM | POA: Diagnosis not present

## 2022-05-14 DIAGNOSIS — K118 Other diseases of salivary glands: Secondary | ICD-10-CM | POA: Diagnosis not present

## 2022-08-01 DIAGNOSIS — G2581 Restless legs syndrome: Secondary | ICD-10-CM | POA: Diagnosis not present

## 2022-08-01 DIAGNOSIS — F5101 Primary insomnia: Secondary | ICD-10-CM | POA: Diagnosis not present

## 2022-08-01 DIAGNOSIS — R030 Elevated blood-pressure reading, without diagnosis of hypertension: Secondary | ICD-10-CM | POA: Diagnosis not present

## 2022-10-24 DIAGNOSIS — H401131 Primary open-angle glaucoma, bilateral, mild stage: Secondary | ICD-10-CM | POA: Diagnosis not present

## 2022-10-25 DIAGNOSIS — L708 Other acne: Secondary | ICD-10-CM | POA: Diagnosis not present

## 2022-10-25 DIAGNOSIS — L821 Other seborrheic keratosis: Secondary | ICD-10-CM | POA: Diagnosis not present

## 2023-04-19 DIAGNOSIS — Z0000 Encounter for general adult medical examination without abnormal findings: Secondary | ICD-10-CM | POA: Diagnosis not present

## 2023-04-19 DIAGNOSIS — G2581 Restless legs syndrome: Secondary | ICD-10-CM | POA: Diagnosis not present

## 2023-04-19 DIAGNOSIS — M810 Age-related osteoporosis without current pathological fracture: Secondary | ICD-10-CM | POA: Diagnosis not present

## 2023-04-19 DIAGNOSIS — G4710 Hypersomnia, unspecified: Secondary | ICD-10-CM | POA: Diagnosis not present

## 2023-04-19 DIAGNOSIS — E782 Mixed hyperlipidemia: Secondary | ICD-10-CM | POA: Diagnosis not present

## 2023-04-19 DIAGNOSIS — F5101 Primary insomnia: Secondary | ICD-10-CM | POA: Diagnosis not present

## 2023-04-24 DIAGNOSIS — H401131 Primary open-angle glaucoma, bilateral, mild stage: Secondary | ICD-10-CM | POA: Diagnosis not present

## 2023-04-24 DIAGNOSIS — Z961 Presence of intraocular lens: Secondary | ICD-10-CM | POA: Diagnosis not present

## 2023-06-28 DIAGNOSIS — R070 Pain in throat: Secondary | ICD-10-CM | POA: Diagnosis not present

## 2023-06-28 DIAGNOSIS — R051 Acute cough: Secondary | ICD-10-CM | POA: Diagnosis not present

## 2023-06-28 DIAGNOSIS — J209 Acute bronchitis, unspecified: Secondary | ICD-10-CM | POA: Diagnosis not present

## 2023-06-28 DIAGNOSIS — H1033 Unspecified acute conjunctivitis, bilateral: Secondary | ICD-10-CM | POA: Diagnosis not present

## 2023-06-28 DIAGNOSIS — R0982 Postnasal drip: Secondary | ICD-10-CM | POA: Diagnosis not present

## 2023-10-18 DIAGNOSIS — H401131 Primary open-angle glaucoma, bilateral, mild stage: Secondary | ICD-10-CM | POA: Diagnosis not present

## 2023-10-26 DIAGNOSIS — Z1231 Encounter for screening mammogram for malignant neoplasm of breast: Secondary | ICD-10-CM | POA: Diagnosis not present

## 2023-10-27 DIAGNOSIS — G2581 Restless legs syndrome: Secondary | ICD-10-CM | POA: Diagnosis not present

## 2023-10-27 DIAGNOSIS — F5101 Primary insomnia: Secondary | ICD-10-CM | POA: Diagnosis not present

## 2023-12-05 DIAGNOSIS — G2581 Restless legs syndrome: Secondary | ICD-10-CM | POA: Diagnosis not present
